# Patient Record
Sex: Female | Born: 1995 | Race: Black or African American | Hispanic: No | Marital: Single | Smoking: Never smoker
Health system: Southern US, Community
[De-identification: ages and names within clinical notes are randomized; demographics above are authoritative.]

## PROBLEM LIST (undated history)

## (undated) ENCOUNTER — Inpatient Hospital Stay (HOSPITAL_COMMUNITY): Payer: Self-pay

## (undated) DIAGNOSIS — R87629 Unspecified abnormal cytological findings in specimens from vagina: Secondary | ICD-10-CM

## (undated) DIAGNOSIS — J45909 Unspecified asthma, uncomplicated: Secondary | ICD-10-CM

## (undated) HISTORY — PX: NO PAST SURGERIES: SHX2092

## (undated) HISTORY — DX: Unspecified abnormal cytological findings in specimens from vagina: R87.629

---
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
## 2017-07-05 ENCOUNTER — Encounter (HOSPITAL_COMMUNITY): Payer: Self-pay

## 2018-06-01 ENCOUNTER — Encounter (HOSPITAL_COMMUNITY): Payer: Self-pay | Admitting: *Deleted

## 2018-06-01 ENCOUNTER — Inpatient Hospital Stay (HOSPITAL_COMMUNITY): Payer: Medicaid Other

## 2018-06-01 ENCOUNTER — Inpatient Hospital Stay (HOSPITAL_COMMUNITY)
Admission: AD | Admit: 2018-06-01 | Discharge: 2018-06-01 | Disposition: A | Discharge status: Home / Self Care | Payer: Medicaid Other | Source: Ambulatory Visit | Attending: Family Medicine | Admitting: Family Medicine

## 2018-06-01 ENCOUNTER — Other Ambulatory Visit: Payer: Self-pay

## 2018-06-01 DIAGNOSIS — O3680X0 Pregnancy with inconclusive fetal viability, not applicable or unspecified: Secondary | ICD-10-CM | POA: Diagnosis not present

## 2018-06-01 DIAGNOSIS — O26891 Other specified pregnancy related conditions, first trimester: Secondary | ICD-10-CM | POA: Diagnosis not present

## 2018-06-01 DIAGNOSIS — Z3A01 Less than 8 weeks gestation of pregnancy: Secondary | ICD-10-CM

## 2018-06-01 DIAGNOSIS — R109 Unspecified abdominal pain: Secondary | ICD-10-CM

## 2018-06-01 DIAGNOSIS — O26899 Other specified pregnancy related conditions, unspecified trimester: Secondary | ICD-10-CM

## 2018-06-01 LAB — URINALYSIS, ROUTINE W REFLEX MICROSCOPIC
BILIRUBIN URINE: NEGATIVE
Glucose, UA: NEGATIVE mg/dL
HGB URINE DIPSTICK: NEGATIVE
KETONES UR: NEGATIVE mg/dL
NITRITE: NEGATIVE
PROTEIN: NEGATIVE mg/dL
Specific Gravity, Urine: 1.014 (ref 1.005–1.030)
pH: 6 (ref 5.0–8.0)

## 2018-06-01 LAB — CBC WITH DIFFERENTIAL/PLATELET
BASOS PCT: 0 %
Basophils Absolute: 0 10*3/uL (ref 0.0–0.1)
EOS ABS: 0.3 10*3/uL (ref 0.0–0.7)
EOS PCT: 3 %
HEMATOCRIT: 35.5 % — AB (ref 36.0–46.0)
Hemoglobin: 12 g/dL (ref 12.0–15.0)
Lymphocytes Relative: 27 %
Lymphs Abs: 2.6 10*3/uL (ref 0.7–4.0)
MCH: 28.7 pg (ref 26.0–34.0)
MCHC: 33.8 g/dL (ref 30.0–36.0)
MCV: 84.9 fL (ref 78.0–100.0)
MONO ABS: 0.2 10*3/uL (ref 0.1–1.0)
MONOS PCT: 3 %
NEUTROS ABS: 6.4 10*3/uL (ref 1.7–7.7)
Neutrophils Relative %: 67 %
PLATELETS: 343 10*3/uL (ref 150–400)
RBC: 4.18 MIL/uL (ref 3.87–5.11)
RDW: 14.7 % (ref 11.5–15.5)
WBC: 9.6 10*3/uL (ref 4.0–10.5)

## 2018-06-01 LAB — HCG, QUANTITATIVE, PREGNANCY: HCG, BETA CHAIN, QUANT, S: 186 m[IU]/mL — AB (ref <5)

## 2018-06-01 LAB — COMPREHENSIVE METABOLIC PANEL
ALT: 18 U/L (ref 0–44)
ANION GAP: 9 (ref 5–15)
AST: 20 U/L (ref 15–41)
Albumin: 3.8 g/dL (ref 3.5–5.0)
Alkaline Phosphatase: 58 U/L (ref 38–126)
BILIRUBIN TOTAL: 0.8 mg/dL (ref 0.3–1.2)
BUN: 9 mg/dL (ref 6–20)
CHLORIDE: 104 mmol/L (ref 98–111)
CO2: 25 mmol/L (ref 22–32)
Calcium: 8.8 mg/dL — ABNORMAL LOW (ref 8.9–10.3)
Creatinine, Ser: 0.77 mg/dL (ref 0.44–1.00)
GFR calc non Af Amer: >60 mL/min (ref >60)
Glucose, Bld: 108 mg/dL — ABNORMAL HIGH (ref 70–99)
POTASSIUM: 3.2 mmol/L — AB (ref 3.5–5.1)
Sodium: 138 mmol/L (ref 135–145)
TOTAL PROTEIN: 7.5 g/dL (ref 6.5–8.1)

## 2018-06-01 LAB — WET PREP, GENITAL
CLUE CELLS WET PREP: NONE SEEN
Sperm: NONE SEEN
Trich, Wet Prep: NONE SEEN
YEAST WET PREP: NONE SEEN

## 2018-06-01 LAB — POCT PREGNANCY, URINE: Preg Test, Ur: POSITIVE — AB

## 2018-06-01 NOTE — MAU Provider Note (Signed)
History     CSN: [id number]  Arrival date and time: 06/01/18 1424   First Provider Initiated Contact with Patient 06/01/18 1621      Chief Complaint  Patient presents with  . Abdominal Pain  . Vaginal Bleeding  . Possible Pregnancy   HPI   Ms.Erika Maldonado is a 22 y.o. female G2P1001 @ 35w2dhere in MAU with abdominal pain. Says the pain started a few days ago. The pain comes and goes. The pain is very mild. She tried taking some tylenol which helped her pain. She noted some bleeding a few days ago, none now.   OB History    Gravida  2   Para  1   Term  1   Preterm      AB      Living  1     SAB      TAB      Ectopic      Multiple      Live Births  1           Past Medical History:  Diagnosis Date  . Asthma     Past Surgical History:  Procedure Laterality Date  . NO PAST SURGERIES      History reviewed. No pertinent family history.  Social History   Tobacco Use  . Smoking status: Never Smoker  . Smokeless tobacco: Never Used  Substance Use Topics  . Alcohol use: No  . Drug use: No    Allergies: No Known Allergies  Medications Prior to Admission  Medication Sig Dispense Refill Last Dose  . ALBUTEROL SULFATE HFA IN Inhale into the lungs.     . Prenatal Vit-Fe Fumarate-FA (MULTIVITAMIN-PRENATAL) 27-0.8 MG TABS tablet Take 1 tablet by mouth daily at 12 noon.      Results for orders placed or performed during the hospital encounter of 06/01/18 (from the past 48 hour(s))  Urinalysis, Routine w reflex microscopic     Status: Abnormal   Collection Time: 06/01/18  2:55 PM  Result Value Ref Range   Color, Urine YELLOW YELLOW   APPearance HAZY (A) CLEAR   Specific Gravity, Urine 1.014 1.005 - 1.030   pH 6.0 5.0 - 8.0   Glucose, UA NEGATIVE NEGATIVE mg/dL   Hgb urine dipstick NEGATIVE NEGATIVE   Bilirubin Urine NEGATIVE NEGATIVE   Ketones, ur NEGATIVE NEGATIVE mg/dL   Protein, ur NEGATIVE NEGATIVE mg/dL   Nitrite NEGATIVE NEGATIVE   Leukocytes, UA SMALL (A) NEGATIVE   RBC / HPF 0-5 0 - 5 RBC/hpf   WBC, UA 0-5 0 - 5 WBC/hpf   Bacteria, UA RARE (A) NONE SEEN   Squamous Epithelial / LPF 11-20 0 - 5   Mucus PRESENT     Comment: Performed at [facility] [address] [place] [id number] Pregnancy, urine POC     Status: Abnormal   Collection Time: 06/01/18  2:58 PM  Result Value Ref Range   Preg Test, Ur POSITIVE (A) NEGATIVE    Comment:        THE SENSITIVITY OF THIS METHODOLOGY IS >24 mIU/mL   CBC with Differential/Platelet     Status: Abnormal   Collection Time: 06/01/18  3:35 PM  Result Value Ref Range   WBC 9.6 4.0 - 10.5 K/uL   RBC 4.18 3.87 - 5.11 MIL/uL   Hemoglobin 12.0 12.0 - 15.0 g/dL   HCT 35.5 (L) 36.0 - 46.0 %   MCV 84.9 78.0 - 100.0 fL   MCH  28.7 26.0 - 34.0 pg   MCHC 33.8 30.0 - 36.0 g/dL   RDW 14.7 11.5 - 15.5 %   Platelets 343 150 - 400 K/uL   Neutrophils Relative % 67 %   Neutro Abs 6.4 1.7 - 7.7 K/uL   Lymphocytes Relative 27 %   Lymphs Abs 2.6 0.7 - 4.0 K/uL   Monocytes Relative 3 %   Monocytes Absolute 0.2 0.1 - 1.0 K/uL   Eosinophils Relative 3 %   Eosinophils Absolute 0.3 0.0 - 0.7 K/uL   Basophils Relative 0 %   Basophils Absolute 0.0 0.0 - 0.1 K/uL    Comment: Performed at [facility], [address] [place], [place] [address]  Comprehensive metabolic panel     Status: Abnormal   Collection Time: 06/01/18  3:35 PM  Result Value Ref Range   Sodium 138 135 - 145 mmol/L   Potassium 3.2 (L) 3.5 - 5.1 mmol/L   Chloride 104 98 - 111 mmol/L   CO2 25 22 - 32 mmol/L   Glucose, Bld 108 (H) 70 - 99 mg/dL   BUN 9 6 - 20 mg/dL   Creatinine, Ser 0.77 0.44 - 1.00 mg/dL   Calcium 8.8 (L) 8.9 - 10.3 mg/dL   Total Protein 7.5 6.5 - 8.1 g/dL   Albumin 3.8 3.5 - 5.0 g/dL   AST 20 15 - 41 U/L   ALT 18 0 - 44 U/L   Alkaline Phosphatase 58 38 - 126 U/L   Total Bilirubin 0.8 0.3 - 1.2 mg/dL   GFR calc non Af Amer >60 >60 mL/min   GFR calc Af Amer >60 >60 mL/min     Comment: (NOTE) The eGFR has been calculated using the CKD EPI equation. This calculation has not been validated in all clinical situations. eGFR's persistently <60 mL/min signify possible Chronic Kidney Disease.    Anion gap 9 5 - 15    Comment: Performed at [facility], [address], [place], [place] [address]  hCG, quantitative, pregnancy     Status: Abnormal   Collection Time: 06/01/18  3:35 PM  Result Value Ref Range   hCG, Beta Chain, Quant, S 186 (H) <5 mIU/mL    Comment:          GEST. AGE      CONC.  (mIU/mL)   <=1 WEEK        5 - 50     2 WEEKS       50 - 500     3 WEEKS       100 - 10,000     4 WEEKS     1,000 - 30,000     5 WEEKS     3,500 - 115,000   6-8 WEEKS     12,000 - 270,000    12 WEEKS     15,000 - 220,000        FEMALE AND NON-PREGNANT FEMALE:     LESS THAN 5 mIU/mL Performed at [facility], [address], [place], [place] [address]   Wet prep, genital     Status: Abnormal   Collection Time: 06/01/18  4:50 PM  Result Value Ref Range   Yeast Wet Prep HPF POC NONE SEEN NONE SEEN   Trich, Wet Prep NONE SEEN NONE SEEN   Clue Cells Wet Prep HPF POC NONE SEEN NONE SEEN   WBC, Wet Prep HPF POC MANY (A) NONE SEEN   Sperm NONE SEEN     Comment: Performed at  [facility], [place], [place] [address]   US Ob Less Than 14 Weeks With Ob Transvaginal  Result Date: 06/01/2018 CLINICAL DATA:  Abdominal pain and first trimester of pregnancy; positive urine pregnancy test, no quantitative beta HCG available for correlation EXAM: OBSTETRIC <14 WK [place] AND TRANSVAGINAL OB US TECHNIQUE: Both transabdominal and transvaginal ultrasound examinations were performed for complete evaluation of the gestation as well as the maternal uterus, adnexal regions, and pelvic cul-de-sac. Transvaginal technique was performed to assess early pregnancy. COMPARISON:  None for this gestation FINDINGS: Intrauterine gestational sac: Not visualized Yolk sac:  N/A Embryo:   N/A Cardiac Activity: N/A Heart Rate: N/A  bpm MSD:   mm    w     d CRL:    mm    w    d                  [place] EDC: Subchorionic hemorrhage:  N/A Maternal uterus/adnexae: Normal uterine morphology. Endometrial complex with trophoblastic reaction measuring up to 21 mm thick. No gestational sac, focal fluid collection or endometrial fluid definitely visualized. Small amount of simple appearing free pelvic fluid. RIGHT ovary measures 2.5 x 3.5 x 2.0 cm and contains a small dominant follicle 19 mm diameter. LEFT ovary measures 3.2 x 1.8 x 2.3 cm and contains a small corpus luteum. No adnexal masses. IMPRESSION: No intrauterine gestation identified. Findings are consistent with pregnancy of unknown location. Differential diagnosis includes early intrauterine pregnancy too early to visualize, spontaneous abortion, and ectopic pregnancy. Serial quantitative beta hCG and or followup ultrasound recommended to definitively exclude ectopic pregnancy. Electronically Signed   By: Lavonia Dana M.D.   On: 06/01/2018 16:33   Review of Systems  Constitutional: Negative for fever.  Gastrointestinal: Positive for abdominal pain. Negative for nausea and vomiting.  Genitourinary: Negative for vaginal bleeding and vaginal discharge.   Physical Exam   Blood pressure 122/63, pulse 94, temperature 98.9 F (37.2 C), temperature source Oral, resp. rate 17, height '5\' 9"'  (1.753 m), weight 134.7 kg, last menstrual period 05/02/2018, unknown if currently breastfeeding.  Physical Exam  Constitutional: She is oriented to person, place, and time. She appears well-developed and well-nourished. No distress.  HENT:  Head: Normocephalic.  Eyes: Pupils are equal, round, and reactive to light.  Neck: Neck supple.  GI: Soft. She exhibits no distension. There is tenderness in the suprapubic area. There is no rigidity, no rebound and no guarding.  Genitourinary:  Genitourinary Comments: Vagina - Small amount of white vaginal discharge, no  odor  Cervix - No contact bleeding, no active bleeding  Bimanual exam: Cervix closed Uterus non tender, normal size Adnexa non tender, no masses bilaterally GC/Chlam, wet prep done Chaperone present for exam.   Musculoskeletal: Normal range of motion.  Neurological: She is alert and oriented to person, place, and time.  Skin: Skin is warm. She is not diaphoretic.  Psychiatric: Her behavior is normal.   MAU Course  Procedures  None  MDM  B positive blood type  Wet prep & GC HIV, CBC, Hcg, ABO US OB transvaginal   Assessment and Plan   A:  1. Pregnancy of unknown anatomic location   2. Abdominal pain in pregnancy, antepartum   3. [redacted] weeks gestation of pregnancy     P:  Discharge home with strict ectopic precautions Pelvic rest  Return Friday after 4:00 pm for stat hcg level (no WOC appointments available) Prenatal vitamins daily Return to MAU if symptoms worsen  Briahna Pescador 06/01/2018, 5:26 PM

## 2018-06-01 NOTE — MAU Note (Signed)
4 +HPT yesterday.  Spotting noted today when wiped.  Cramping started a couple days ago, really bad today.

## 2018-06-01 NOTE — Discharge Instructions (Signed)

## 2018-06-02 LAB — GC/CHLAMYDIA PROBE AMP (~~LOC~~) NOT AT ARMC
Chlamydia: NEGATIVE
NEISSERIA GONORRHEA: NEGATIVE

## 2018-07-15 ENCOUNTER — Encounter: Payer: Medicaid Other | Admitting: Obstetrics and Gynecology

## 2018-07-18 ENCOUNTER — Telehealth: Payer: Self-pay | Admitting: Family Medicine

## 2018-07-18 NOTE — Telephone Encounter (Signed)
Patient called to make an appointment to start her prenatal care. She requested to be seen in the [facility] office.

## 2018-07-31 IMAGING — US US MFM OB LIMITED
1 series · 15 of 28 positions shown · non-contrast
Comparison: none

[Series 1: us mfm ob limited · 31 acquisitions, 15 frames shown]
[im 1/31]
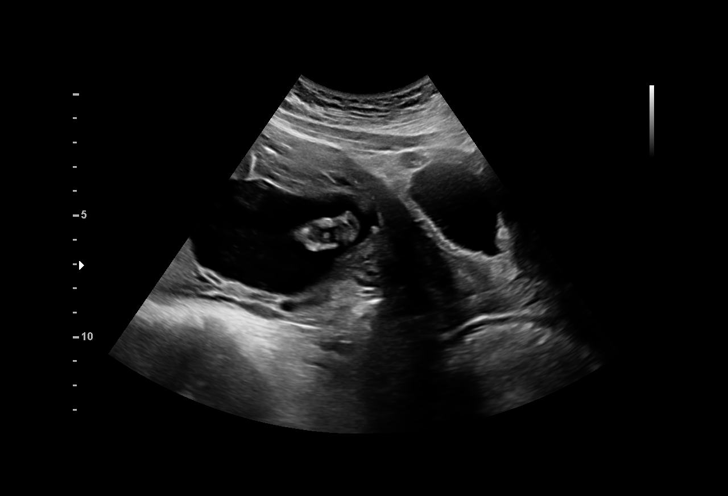
[im 3/31]
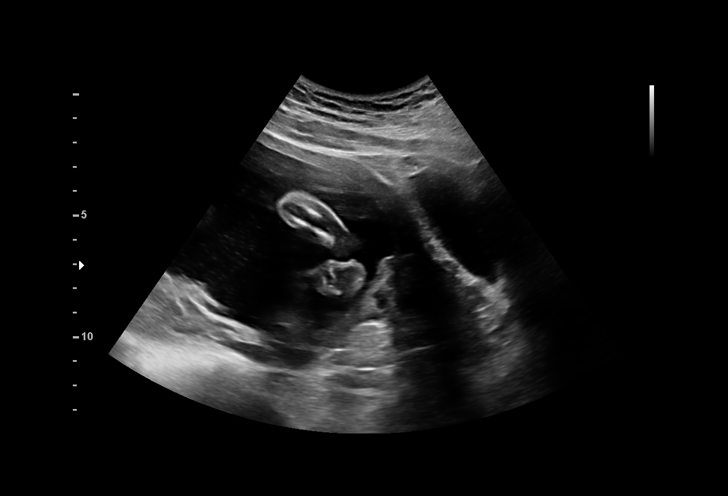
[im 5/31]
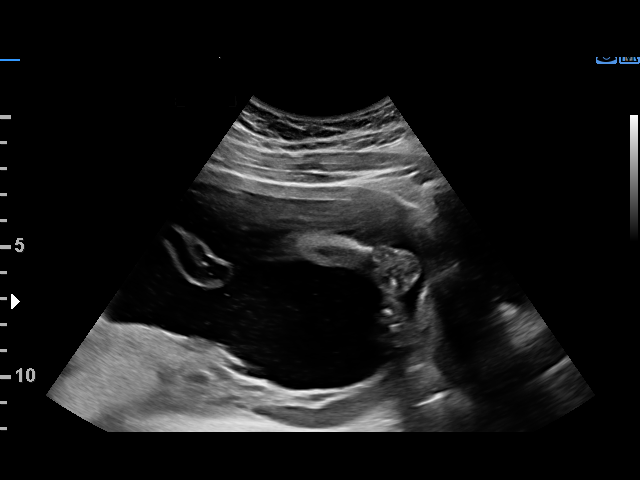
[im 7/31]
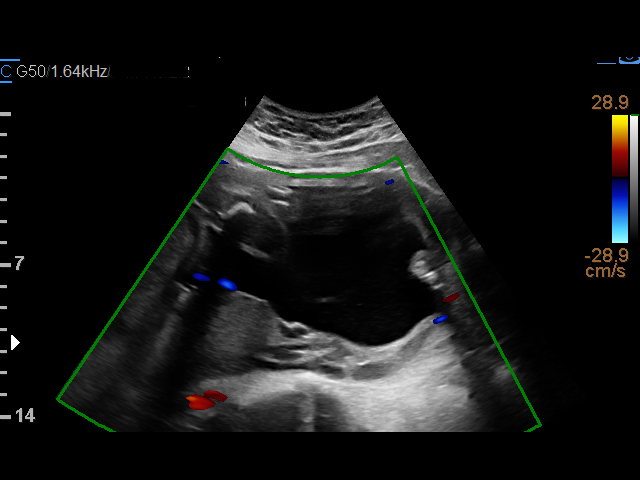
[im 9/31]
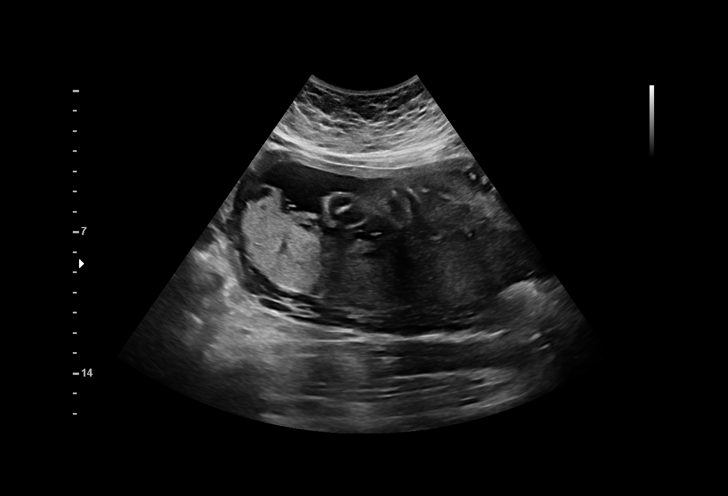
[im 12/31]
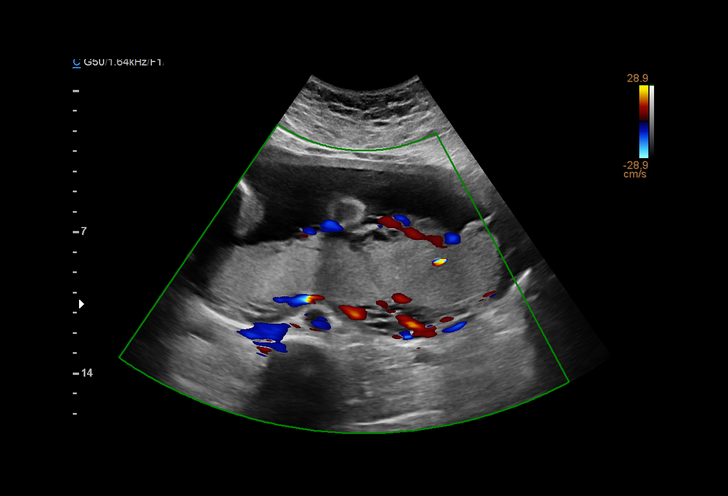
[im 14/31]
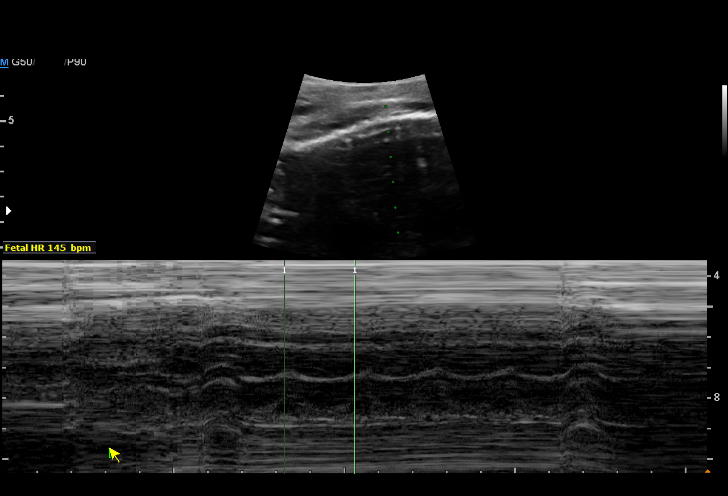
[im 16/31]
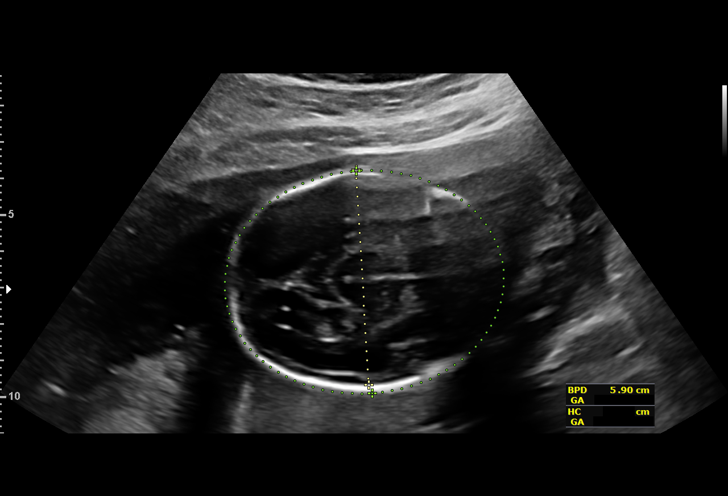
[im 17/31]
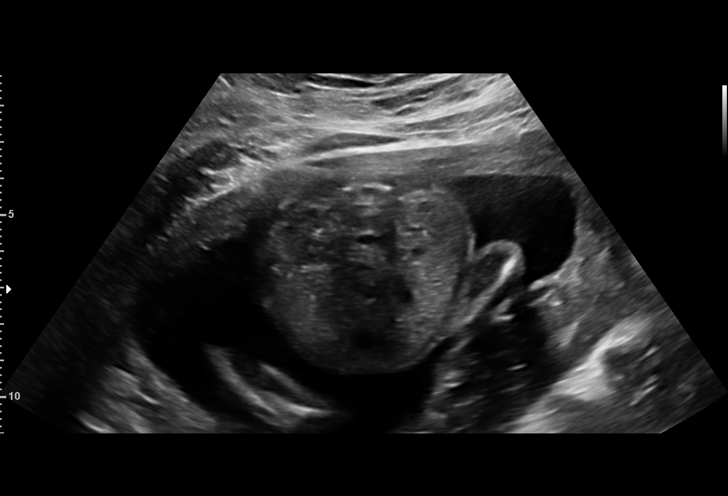
[im 19/31]
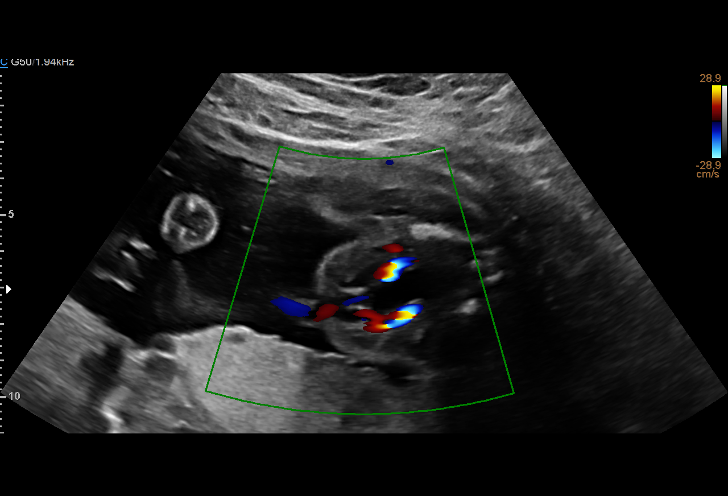
[im 22/31]
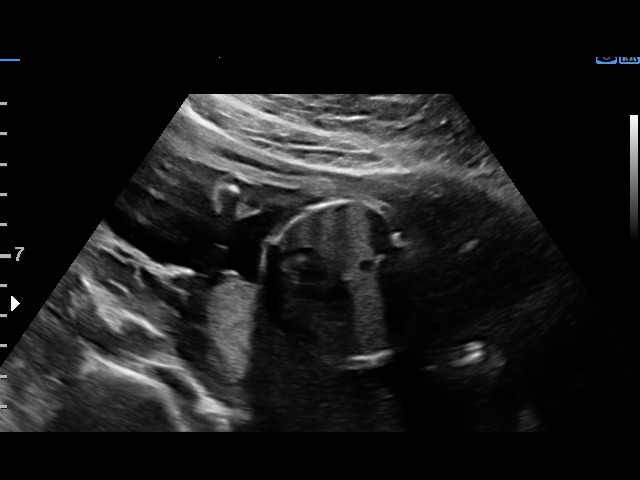
[im 24/31]
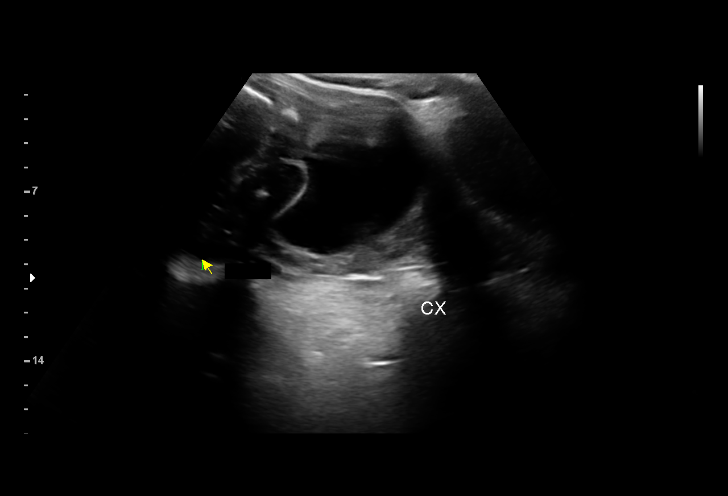
[im 26/31]
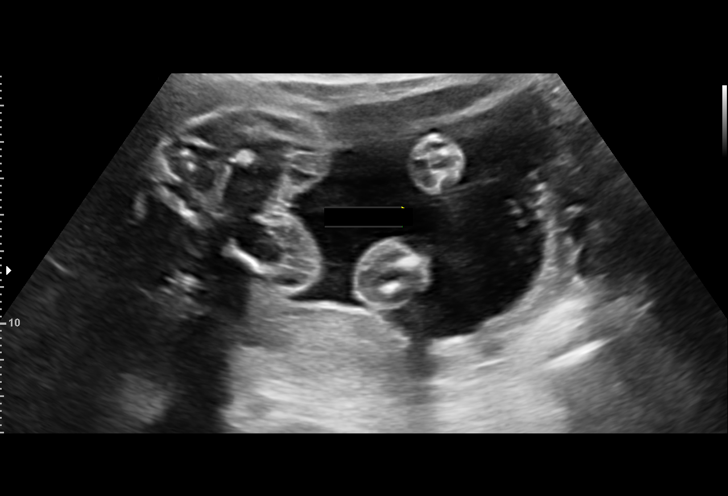
[im 28/31]
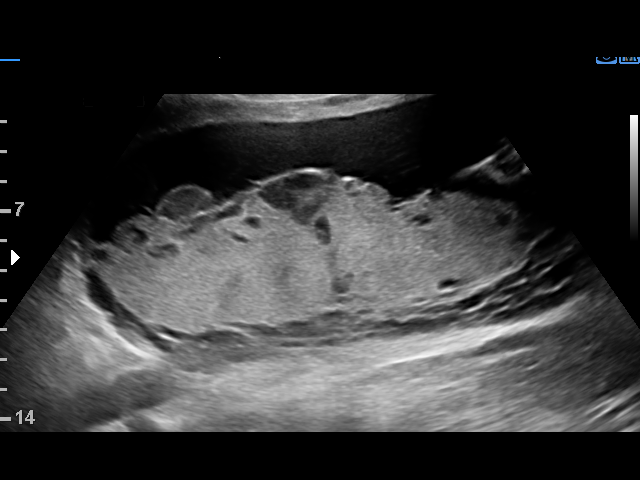
[im 31/31]
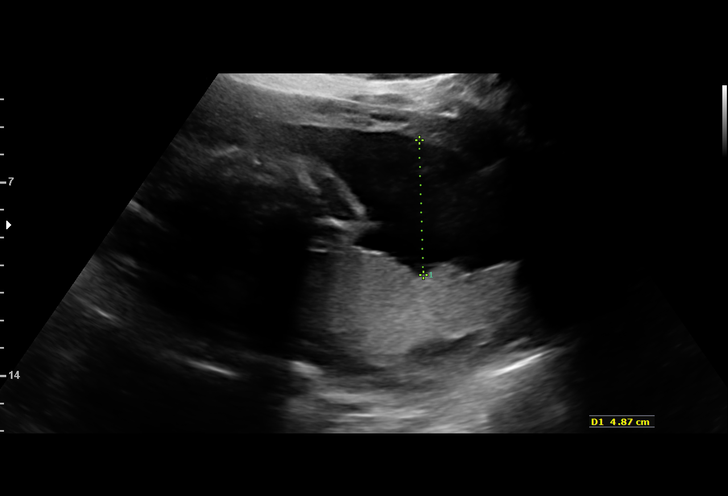

[15 of 28 positions shown; findings below may reference images not displayed]

Indications

24 weeks gestation of pregnancy
Vaginal bleeding in pregnancy, second
trimester
Abdominal pain in pregnancy
OB History

Gravidity:    1
Fetal Evaluation

Num Of Fetuses:     1
Fetal Heart         145
Rate(bpm):
Cardiac Activity:   Observed
Presentation:       Breech
Placenta:           Posterior, above cervical os
P. Cord Insertion:  Visualized

Amniotic Fluid
AFI FV:      Subjectively within normal limits

Largest Pocket(cm)
4.8

Comment:    No placental abruption or previa identified.
Biometry

BPD:        59  mm     G. Age:  24w 1d         48  %    CI:        74.24   %   70 - 86
HC:      217.4  mm     G. Age:  23w 5d         25  %
Gestational Age
LMP:           24w 0d       Date:   06/06/16                 EDD:   03/13/17
U/S Today:     24w 0d                                        EDD:   03/13/17
Best:          24w 0d    Det. By:   LMP  (06/06/16)          EDD:   03/13/17
Anatomy

Ventricles:            Appears normal         Kidneys:                Appear normal
Stomach:               Appears normal, left   Bladder:                Appears normal
sided
Abdominal Wall:        Appears nml (cord
insert, abd wall)

Other:  Fetus appears to be a female.
Cervix Uterus Adnexa

Cervix
Length:              4  cm.
Normal appearance by transabdominal scan.

Left Ovary
No adnexal mass visualized.

Right Ovary
No adnexal mass visualized.
Impression

SIUP at  85w2d with abdominal pain
Normal amniotic fluid volume and placentation
Normal fetal movement and cardiac activity
Cervical length 40mm transabdominally
Recommendations

No US explanation for patien'ts symptoms. continue clinical
evaluation and management

## 2018-08-10 ENCOUNTER — Encounter: Payer: Medicaid Other | Admitting: Certified Nurse Midwife

## 2018-08-18 ENCOUNTER — Encounter: Payer: Medicaid Other | Admitting: Obstetrics and Gynecology

## 2018-08-31 NOTE — L&D Delivery Note (Signed)
Delivery Note   IOL for morbid obesity. Patient received Cytotec x2 and labored rapidly At 10:22 AM a viable female, named Erika Maldonado was delivered via  (Presentation:LOA  ).  APGAR: 8, 9; weight pending Placenta status: spontaneous with 3 Vx Cord  Anesthesia:  epidural and paracervical block with 20 cc of Lidocaine 1% Episiotomy: None Lacerations: Vaginal;Labial Suture Repair: 3.0 Monocryl Est. Blood Loss (mL): 200  Mom to postpartum.  Baby to Couplet care / Skin to Skin. Plans breastfeeding and outpatient circumcision  Dede Query Rakwon Letourneau 02/07/2019, 10:48 AM

## 2019-01-13 LAB — OB RESULTS CONSOLE GBS: GBS: NEGATIVE

## 2019-01-25 ENCOUNTER — Telehealth (HOSPITAL_COMMUNITY): Payer: Self-pay | Admitting: *Deleted

## 2019-01-25 ENCOUNTER — Encounter (HOSPITAL_COMMUNITY): Payer: Self-pay | Admitting: *Deleted

## 2019-01-25 NOTE — Telephone Encounter (Signed)
Preadmission screen Pt refuses to come for early covid testing due to living in [place].  Office notified.  Pt states she plans to refuse testing on admission due to not wanting anything in her nose.  Explained importance of screening for her health and the health of her infant.  [facility] office notified of pt concerns and plan.

## 2019-02-01 ENCOUNTER — Other Ambulatory Visit: Payer: Self-pay | Admitting: Obstetrics and Gynecology

## 2019-02-03 ENCOUNTER — Other Ambulatory Visit (HOSPITAL_COMMUNITY): Admission: RE | Admit: 2019-02-03 | Payer: Medicaid Other | Source: Ambulatory Visit

## 2019-02-06 ENCOUNTER — Other Ambulatory Visit (HOSPITAL_COMMUNITY): Payer: Self-pay | Admitting: *Deleted

## 2019-02-06 NOTE — H&P (Signed)
Erika CarrowJada Maldonado is a 23 y.o. female, G2P1001, IUP at 40 weeks, presenting for IOL for maternal obesity, BMI 43, substance abuse during pregnancy, marijauna, +Utox on 05/2018, last use 05/2018., Abnormal pap (05/2018, LGSIL at prior office. Will plan repeat in 1 year per ASCCP guidelines.). Asthma, no complication or meds. Pt [place] from [place]. Pt endorse + Fm. Denies vaginal leakage. Denies vaginal bleeding. Denies feeling cxt's. Low risk female. GBS neg. Hep B status unable to find results. Pt does not want covid testing, denies s/sx, denies exposure. EFW 7.13lbs via US on 5/20.  Patient Active Problem List   Diagnosis Date Noted  . Obesity complicating pregnancy in third trimester 02/07/2019    Medications Prior to Admission  Medication Sig Dispense Refill Last Dose  . Prenatal Vit-Fe Fumarate-FA (MULTIVITAMIN-PRENATAL) 27-0.8 MG TABS tablet Take 1 tablet by mouth daily at 12 noon.   02/06/2019 at Unknown time  . ALBUTEROL SULFATE HFA IN Inhale into the lungs.   More than a month at Unknown time    Past Medical History:  Diagnosis Date  . Asthma   . Vaginal Pap smear, abnormal      No current facility-administered medications on file prior to encounter.    Current Outpatient Medications on File Prior to Encounter  Medication Sig Dispense Refill  . Prenatal Vit-Fe Fumarate-FA (MULTIVITAMIN-PRENATAL) 27-0.8 MG TABS tablet Take 1 tablet by mouth daily at 12 noon.    . ALBUTEROL SULFATE HFA IN Inhale into the lungs.       No Known Allergies  History of present pregnancy: Pt Info/Preference:  Screening/Consents:  Labs:   EDD: Estimated Date of Delivery: 02/06/19  Establised: Patient's last menstrual period was 05/02/2018.  Anatomy Scan: Date: 11/01/2018 Placenta Location: anterior Genetic Screen: Panoroma:low risk female AFP:  First Tri: Quad:  Office: [facility]            First PNV: 13 wg Blood Type --/--/B POS (06/09 0019)  Language: english Last PNV: 39 wg Rhogam    Flu Vaccine:  declined    Antibody NEG (06/09 0019)  TDaP vaccine declined   GTT: Early: N/A Third Trimester: 115  Feeding Plan: Breast/bottle BTL: no Rubella:    Contraception: ??? VBAC: no RPR:     Circumcision: no   HBsAg:    Pediatrician:  ???   HIV:     Prenatal Classes: no Additional US: Last BPP 8/8 Growth 5/20 see below GBS: Negative (05/15 0000)(For PCN allergy, check sensitivities)       Chlamydia: neg    MFM Referral/Consult:  GC: neg  Support Person: parnter   PAP: 2019-LGSIL  Pain Management: epidural Neonatologist Referral:  Hgb Electrophoresis:  N/A  Birth Plan: none   Hgb NOB: 12    28W: 12  Growth and BPP 01/18/2019  OB History    Gravida  2   Para  1   Term  1   Preterm      AB      Living  1     SAB      TAB      Ectopic      Multiple      Live Births  1          Past Medical History:  Diagnosis Date  . Asthma   . Vaginal Pap smear, abnormal    Past Surgical History:  Procedure Laterality Date  . NO PAST SURGERIES     Family History: family history includes Asthma in her paternal grandfather; Other  in her maternal grandmother. Social History:  reports that she has never smoked. She has never used smokeless tobacco. She reports that she does not drink alcohol or use drugs.   Prenatal Transfer Tool  Maternal Diabetes: No Genetic Screening: Normal Maternal Ultrasounds/Referrals: Normal Fetal Ultrasounds or other Referrals:  None Maternal Substance Abuse:  Yes:  Type: Marijuana Significant Maternal Medications:  None Significant Maternal Lab Results: Lab values include: Group B Strep negative  ROS:  Review of Systems  Constitutional: Negative.   HENT: Negative.   Eyes: Negative.   Respiratory: Negative.   Cardiovascular: Negative.   Gastrointestinal: Negative.   Genitourinary: Negative.   Musculoskeletal: Negative.   Skin: Negative.   Neurological: Negative.   Endo/Heme/Allergies: Negative.   Psychiatric/Behavioral: Negative.      Physical Exam:  BP (!) 112/57   Pulse 79   Temp 98.3 F (36.8 C) (Oral)   Resp 18   Ht 5\' 9"  (1.753 m)   Wt (!) 146.4 kg   LMP 05/02/2018   SpO2 98%   BMI 47.67 kg/m   Physical Exam  Constitutional: She is oriented to person, place, and time and well-developed, well-nourished, and in no distress.  HENT:  Head: Normocephalic and atraumatic.  Eyes: Pupils are equal, round, and reactive to light. Conjunctivae are normal.  Neck: Normal range of motion. Neck supple.  Cardiovascular: Normal rate and regular rhythm.  Pulmonary/Chest: Effort normal and breath sounds normal.  Abdominal: Soft. Bowel sounds are normal.  Genitourinary:    Genitourinary Comments: Pelvis adequate, soft non-tender uterus, gravida equal to dates.    Musculoskeletal: Normal range of motion.  Neurological: She is alert and oriented to person, place, and time. She has normal reflexes. Gait normal.  Skin: Skin is warm and dry.  Psychiatric: Affect normal.  Nursing note and vitals reviewed.    NST: FHR baseline 130 bpm, Variability: moderate, Accelerations:present, Decelerations:  Absent= Cat 1/Reactive UC:   none SVE:   Dilation: Closed Effacement (%): 20 Station: Ballotable Exam by:: J. Wilmer Santillo, CNM, vertex verified by fetal sutures.  Leopold's: Position vertex, EFW 8.5lbs via leopold's.  Pelvis proven 7.5lbs  Labs: Results for orders placed or performed during the hospital encounter of 02/07/19 (from the past 24 hour(s))  CBC     Status: Abnormal   Collection Time: 02/07/19 12:10 AM  Result Value Ref Range   WBC 7.4 4.0 - 10.5 K/uL   RBC 4.17 3.87 - 5.11 MIL/uL   Hemoglobin 11.9 (L) 12.0 - 15.0 g/dL   HCT 16.135.6 (L) 09.636.0 - 04.546.0 %   MCV 85.4 80.0 - 100.0 fL   MCH 28.5 26.0 - 34.0 pg   MCHC 33.4 30.0 - 36.0 g/dL   RDW 40.913.6 81.111.5 - 91.415.5 %   Platelets 280 150 - 400 K/uL   nRBC 0.0 0.0 - 0.2 %  Type and screen     Status: None   Collection Time: 02/07/19 12:19 AM  Result Value Ref Range   ABO/RH(D) B POS    Antibody  Screen NEG    Sample Expiration      02/10/2019,2359 Performed at [facility], [address] [place], [place] [address]     Imaging:  No results found.  MAU Course: Orders Placed This Encounter  Procedures  . SARS Coronavirus 2 (CEPHEID - Performed [facility]), [facility] Order  . Novel Coronavirus, NAA (hospital order; send-out to ref lab)  . OB RESULT CONSOLE Group B Strep  . Hepatitis B surface  antibody  . CBC  . RPR  . Diet regular Room service appropriate? Yes; Fluid consistency: Thin  . Discontinue Pitocin if tachysystole with non-reassuring FHR is present  . Evaluate fetal heart rate to establish reassuring pattern prior to initiating Cytotec or Pitocin  . If tachysystole WITH reassuring FHR present notify MD / CNM  . Initiate intrauterine resuscitation if tachysystole with non-reasuring FHR is present  . Labor Induction  . May administer Terbutaline 0.25 mg SQ x 1 dose if tachysystole with non-reassuring FHR is presesnt  . Nofify MD/CNM if tachysystole with non-reassuring FHR is present  . Perform a cervical exam prior to initiating Cytotec or Pitocin  . Activity as tolerated  . Fetal monitoring per unit policy  . Notify Physician  . Vitals signs per unit policy  . Order Rapid HIV per protocol if no results on chart  . Cervical Exam  . Discontinue foley prior to vaginal delivery  . Fundal check post delivery every 15 min x 1 hour then every 30 min x 1 hour  . If Rapid HIV test positive or known HIV positive: initiate AZT orders  . Initiate Carrier Fluid Protocol  . Initiate Oral Care Protocol  . Insert foley catheter  . May in and out cath x 2 for inability to void  . Measure blood pressure post delivery every 15 min x 1 hour then every 30 min x 1 hour  . Patient may have epidural placement upon request  . Full code  . Type and screen  . Insert and maintain IV Line  . Admit to Inpatient (patient's expected length of stay will be greater than 2  midnights or inpatient only procedure)   Meds ordered this encounter  Medications  . misoprostol (CYTOTEC) tablet 25 mcg  . oxytocin (PITOCIN) IV infusion 40 units in NS 1000 mL - Premix    Order Specific Question:   Begin infusion at:    Answer:   1 milli-unit/min (1.5 mL/hr)    Order Specific Question:   Increase infusion by:    Answer:   1 milli-unit/min (1.5 mL/hr)  . terbutaline (BRETHINE) injection 0.25 mg  . lactated ringers infusion  . lactated ringers infusion 500-1,000 mL  . oxytocin (PITOCIN) IV BOLUS FROM BAG  . oxytocin (PITOCIN) IV infusion 40 units in NS 1000 mL - Premix  . acetaminophen (TYLENOL) tablet 650 mg  . fentaNYL (SUBLIMAZE) injection 50-100 mcg  . lidocaine (PF) (XYLOCAINE) 1 % injection 30 mL  . ondansetron (ZOFRAN) injection 4 mg  . sodium citrate-citric acid (ORACIT) solution 30 mL  . sodium phosphate (FLEET) 7-19 GM/118ML enema 1 enema    Assessment/Plan: Erika Maldonado is a 23 y.o. female, G2P1001, IUP at 40 weeks, presenting for IOL for maternal obesity, BMI 43, substance abuse during pregnancy, marijauna, +Utox on 05/2018, last use 05/2018., Abnormal pap (05/2018, LGSIL at prior office. Will plan repeat in 1 year per ASCCP guidelines.). Asthma, no complication or meds. Pt drove from [place]. Pt endorse + Fm. Denies vaginal leakage. Denies vaginal bleeding. Denies feeling cxt's. Low risk female, no circ desired. GBS neg. Hep B status unable to find results. Pt does not want covid testing, denies s/sx, denies exposure. EFW 7.13lbs via [place] on 5/20.   FWB: Cat 1 Fetal Tracing.   Plan: Admit to [facility] per consult with Dr Simona Huh Routine [facility] orders Covid: Pending Pain med/epidural prn Induction with Cytotec.  Anticipate labor progression  Hep B Unknown: Pending lab.   [facility]  NP-C, CNM, MSN 02/07/2019, 2:12 AM

## 2019-02-07 ENCOUNTER — Other Ambulatory Visit: Payer: Self-pay

## 2019-02-07 ENCOUNTER — Encounter (HOSPITAL_COMMUNITY): Payer: Self-pay

## 2019-02-07 ENCOUNTER — Inpatient Hospital Stay (HOSPITAL_COMMUNITY): Payer: Medicaid Other

## 2019-02-07 ENCOUNTER — Inpatient Hospital Stay (HOSPITAL_COMMUNITY)
Admission: AD | Admit: 2019-02-07 | Discharge: 2019-02-09 | DRG: 807 | Disposition: A | Discharge status: Home / Self Care | Payer: Medicaid Other | Attending: Obstetrics & Gynecology | Admitting: Obstetrics & Gynecology

## 2019-02-07 ENCOUNTER — Inpatient Hospital Stay (HOSPITAL_COMMUNITY): Payer: Medicaid Other | Admitting: Anesthesiology

## 2019-02-07 DIAGNOSIS — Z1159 Encounter for screening for other viral diseases: Secondary | ICD-10-CM

## 2019-02-07 DIAGNOSIS — Z3A40 40 weeks gestation of pregnancy: Secondary | ICD-10-CM

## 2019-02-07 DIAGNOSIS — O99324 Drug use complicating childbirth: Secondary | ICD-10-CM | POA: Diagnosis present

## 2019-02-07 DIAGNOSIS — O99214 Obesity complicating childbirth: Principal | ICD-10-CM | POA: Diagnosis present

## 2019-02-07 DIAGNOSIS — J45909 Unspecified asthma, uncomplicated: Secondary | ICD-10-CM | POA: Diagnosis present

## 2019-02-07 DIAGNOSIS — O99213 Obesity complicating pregnancy, third trimester: Secondary | ICD-10-CM | POA: Diagnosis present

## 2019-02-07 DIAGNOSIS — O9952 Diseases of the respiratory system complicating childbirth: Secondary | ICD-10-CM | POA: Diagnosis present

## 2019-02-07 DIAGNOSIS — F1210 Cannabis abuse, uncomplicated: Secondary | ICD-10-CM | POA: Diagnosis present

## 2019-02-07 LAB — CBC
HCT: 35.6 % — ABNORMAL LOW (ref 36.0–46.0)
Hemoglobin: 11.9 g/dL — ABNORMAL LOW (ref 12.0–15.0)
MCH: 28.5 pg (ref 26.0–34.0)
MCHC: 33.4 g/dL (ref 30.0–36.0)
MCV: 85.4 fL (ref 80.0–100.0)
Platelets: 280 10*3/uL (ref 150–400)
RBC: 4.17 MIL/uL (ref 3.87–5.11)
RDW: 13.6 % (ref 11.5–15.5)
WBC: 7.4 10*3/uL (ref 4.0–10.5)
nRBC: 0 % (ref 0.0–0.2)

## 2019-02-07 LAB — TYPE AND SCREEN
ABO/RH(D): B POS
Antibody Screen: NEGATIVE

## 2019-02-07 LAB — RPR: RPR Ser Ql: NONREACTIVE

## 2019-02-07 LAB — SARS CORONAVIRUS 2: SARS Coronavirus 2: NOT DETECTED

## 2019-02-07 MED ORDER — FENTANYL CITRATE (PF) 100 MCG/2ML IJ SOLN
50.0000 ug | INTRAMUSCULAR | Status: DC | PRN
  Administered 2019-02-07: 50 ug via INTRAVENOUS
  Filled 2019-02-07: qty 2

## 2019-02-07 MED ORDER — ONDANSETRON HCL 4 MG/2ML IJ SOLN: 4.0000 mg | INTRAMUSCULAR | Status: DC | PRN

## 2019-02-07 MED ORDER — MISOPROSTOL 25 MCG QUARTER TABLET
25.0000 ug | ORAL_TABLET | ORAL | Status: DC | PRN
  Administered 2019-02-07 (×2): 25 ug via VAGINAL
  Filled 2019-02-07 (×2): qty 1

## 2019-02-07 MED ORDER — ONDANSETRON HCL 4 MG/2ML IJ SOLN: 4.0000 mg | Freq: Four times a day (QID) | INTRAMUSCULAR | Status: DC | PRN

## 2019-02-07 MED ORDER — WITCH HAZEL-GLYCERIN EX PADS: 1.0000 "application " | MEDICATED_PAD | CUTANEOUS | Status: DC | PRN

## 2019-02-07 MED ORDER — FLEET ENEMA 7-19 GM/118ML RE ENEM: 1.0000 | ENEMA | RECTAL | Status: DC | PRN

## 2019-02-07 MED ORDER — TETANUS-DIPHTH-ACELL PERTUSSIS 5-2.5-18.5 LF-MCG/0.5 IM SUSP: 0.5000 mL | Freq: Once | INTRAMUSCULAR | Status: DC

## 2019-02-07 MED ORDER — LACTATED RINGERS IV SOLN: 500.0000 mL | INTRAVENOUS | Status: DC | PRN

## 2019-02-07 MED ORDER — BENZOCAINE-MENTHOL 20-0.5 % EX AERO
1.0000 "application " | INHALATION_SPRAY | CUTANEOUS | Status: DC | PRN
  Administered 2019-02-07: 1 via TOPICAL
  Filled 2019-02-07: qty 56

## 2019-02-07 MED ORDER — LACTATED RINGERS IV SOLN
INTRAVENOUS | Status: DC
  Administered 2019-02-07 (×2): via INTRAVENOUS

## 2019-02-07 MED ORDER — ONDANSETRON HCL 4 MG PO TABS: 4.0000 mg | ORAL_TABLET | ORAL | Status: DC | PRN

## 2019-02-07 MED ORDER — OXYTOCIN 40 UNITS IN NORMAL SALINE INFUSION - SIMPLE MED: 1.0000 m[IU]/min | INTRAVENOUS | Status: DC

## 2019-02-07 MED ORDER — SENNOSIDES-DOCUSATE SODIUM 8.6-50 MG PO TABS
2.0000 | ORAL_TABLET | ORAL | Status: DC
  Administered 2019-02-08 – 2019-02-09 (×2): 2 via ORAL
  Filled 2019-02-07 (×2): qty 2

## 2019-02-07 MED ORDER — PHENYLEPHRINE 40 MCG/ML (10ML) SYRINGE FOR IV PUSH (FOR BLOOD PRESSURE SUPPORT): 80.0000 ug | PREFILLED_SYRINGE | INTRAVENOUS | Status: DC | PRN

## 2019-02-07 MED ORDER — DIPHENHYDRAMINE HCL 25 MG PO CAPS: 25.0000 mg | ORAL_CAPSULE | Freq: Four times a day (QID) | ORAL | Status: DC | PRN

## 2019-02-07 MED ORDER — DIPHENHYDRAMINE HCL 50 MG/ML IJ SOLN: 12.5000 mg | INTRAMUSCULAR | Status: DC | PRN

## 2019-02-07 MED ORDER — ENOXAPARIN SODIUM 80 MG/0.8ML ~~LOC~~ SOLN
0.5000 mg/kg | SUBCUTANEOUS | Status: DC
  Filled 2019-02-07: qty 0.8

## 2019-02-07 MED ORDER — SODIUM CHLORIDE (PF) 0.9 % IJ SOLN
INTRAMUSCULAR | Status: DC | PRN
  Administered 2019-02-07: 12 mL/h via EPIDURAL

## 2019-02-07 MED ORDER — EPHEDRINE 5 MG/ML INJ: 10.0000 mg | INTRAVENOUS | Status: DC | PRN

## 2019-02-07 MED ORDER — LIDOCAINE HCL 1 % IJ SOLN
10.0000 mL | Freq: Once | INTRAMUSCULAR | Status: AC
  Administered 2019-02-07: 30 mL
  Filled 2019-02-07: qty 10

## 2019-02-07 MED ORDER — IBUPROFEN 600 MG PO TABS
600.0000 mg | ORAL_TABLET | Freq: Four times a day (QID) | ORAL | Status: DC
  Administered 2019-02-07 – 2019-02-09 (×9): 600 mg via ORAL
  Filled 2019-02-07 (×9): qty 1

## 2019-02-07 MED ORDER — FENTANYL-BUPIVACAINE-NACL 0.5-0.125-0.9 MG/250ML-% EP SOLN: 12.0000 mL/h | EPIDURAL | Status: DC | PRN

## 2019-02-07 MED ORDER — SOD CITRATE-CITRIC ACID 500-334 MG/5ML PO SOLN: 30.0000 mL | ORAL | Status: DC | PRN

## 2019-02-07 MED ORDER — FENTANYL-BUPIVACAINE-NACL 0.5-0.125-0.9 MG/250ML-% EP SOLN
EPIDURAL | Status: AC
  Filled 2019-02-07: qty 250

## 2019-02-07 MED ORDER — TERBUTALINE SULFATE 1 MG/ML IJ SOLN: 0.2500 mg | Freq: Once | INTRAMUSCULAR | Status: DC | PRN

## 2019-02-07 MED ORDER — MEASLES, MUMPS & RUBELLA VAC IJ SOLR: 0.5000 mL | Freq: Once | INTRAMUSCULAR | Status: DC

## 2019-02-07 MED ORDER — LACTATED RINGERS IV SOLN
500.0000 mL | Freq: Once | INTRAVENOUS | Status: AC
  Administered 2019-02-07: 500 mL via INTRAVENOUS

## 2019-02-07 MED ORDER — LIDOCAINE HCL (PF) 1 % IJ SOLN
INTRAMUSCULAR | Status: DC | PRN
  Administered 2019-02-07 (×2): 5 mL via EPIDURAL

## 2019-02-07 MED ORDER — DIBUCAINE (PERIANAL) 1 % EX OINT: 1.0000 "application " | TOPICAL_OINTMENT | CUTANEOUS | Status: DC | PRN

## 2019-02-07 MED ORDER — PRENATAL MULTIVITAMIN CH
1.0000 | ORAL_TABLET | Freq: Every day | ORAL | Status: DC
  Administered 2019-02-07 – 2019-02-09 (×3): 1 via ORAL
  Filled 2019-02-07 (×3): qty 1

## 2019-02-07 MED ORDER — COCONUT OIL OIL: 1.0000 "application " | TOPICAL_OIL | Status: DC | PRN

## 2019-02-07 MED ORDER — FERROUS SULFATE 325 (65 FE) MG PO TABS
325.0000 mg | ORAL_TABLET | Freq: Two times a day (BID) | ORAL | Status: DC
  Administered 2019-02-07 – 2019-02-09 (×4): 325 mg via ORAL
  Filled 2019-02-07 (×5): qty 1

## 2019-02-07 MED ORDER — SIMETHICONE 80 MG PO CHEW: 80.0000 mg | CHEWABLE_TABLET | ORAL | Status: DC | PRN

## 2019-02-07 MED ORDER — OXYTOCIN BOLUS FROM INFUSION
500.0000 mL | Freq: Once | INTRAVENOUS | Status: AC
  Administered 2019-02-07: 500 mL via INTRAVENOUS

## 2019-02-07 MED ORDER — ACETAMINOPHEN 325 MG PO TABS: 650.0000 mg | ORAL_TABLET | ORAL | Status: DC | PRN

## 2019-02-07 MED ORDER — LIDOCAINE HCL (PF) 1 % IJ SOLN
30.0000 mL | INTRAMUSCULAR | Status: AC | PRN
  Administered 2019-02-07: 30 mL via SUBCUTANEOUS
  Filled 2019-02-07 (×2): qty 30

## 2019-02-07 MED ORDER — ACETAMINOPHEN 325 MG PO TABS
650.0000 mg | ORAL_TABLET | ORAL | Status: DC | PRN
  Administered 2019-02-07 – 2019-02-08 (×2): 650 mg via ORAL
  Filled 2019-02-07 (×2): qty 2

## 2019-02-07 MED ORDER — OXYTOCIN 40 UNITS IN NORMAL SALINE INFUSION - SIMPLE MED
2.5000 [IU]/h | INTRAVENOUS | Status: DC
  Administered 2019-02-07: 2.5 [IU]/h via INTRAVENOUS
  Filled 2019-02-07: qty 1000

## 2019-02-07 MED ORDER — ZOLPIDEM TARTRATE 5 MG PO TABS: 5.0000 mg | ORAL_TABLET | Freq: Every evening | ORAL | Status: DC | PRN

## 2019-02-07 NOTE — Anesthesia Postprocedure Evaluation (Signed)
Anesthesia Post Note  Patient: Erika Maldonado  Procedure(s) Performed: AN AD [place]     Patient location during evaluation: Mother Baby Anesthesia Type: Epidural Level of consciousness: awake and alert Pain management: pain level controlled Vital Signs Assessment: post-procedure vital signs reviewed and stable Respiratory status: spontaneous breathing, nonlabored ventilation and respiratory function stable Cardiovascular status: stable Postop Assessment: no headache, no backache and epidural receding Anesthetic complications: no    Last Vitals:  Vitals:   02/07/19 1213 02/07/19 1322  BP: (!) 76/33 (!) 102/52  Pulse: 76 71  Resp: 18   Temp: 37 C 36.9 C  SpO2: 97% 97%    Last Pain:  Vitals:   02/07/19 1325  TempSrc:   PainSc: 0-No pain   Pain Goal:                   [name]

## 2019-02-07 NOTE — Lactation Note (Signed)
This note was copied from a baby's chart. Lactation Consultation Note  Patient Name: Erika Maldonado [id number] Date: 02/07/2019 Reason for consult: Initial assessment;Term  P2 mother whose infant is now 72 hours old.  Baby was asleep at mother's side when I arrived.  He had fed 25 mls of formula two hours ago.  Mother's feeding preference is breast/bottle.  Discussed infant's tummy size and provided feeding supplementation guideline sheet for parents.  Reviewed feeding cues.  Mother demonstrated hand expression but was unable to obtain colostrum drops at this time.  Colostrum container provided for any EBM she obtains with hand expression.  Milk storage times reviewed and finger feeding demonstrated.  Encouraged feeding 8-12 times/24 hours or sooner if baby shows feeding cues.  Reminded mother to always put baby to breast prior to giving formula.  Suggested alternative ways of feeding rather than using an artificial nipple if mother desires.  Mother will call for latch assistance as needed.    Mother will return to work after leave and does not have a DEBP for home use.  She is a [facility] participant and [place] form faxed.  Explained how to obtain her pump after discharge.  Father present.  Mom made aware of O/P services, breastfeeding support groups, community resources, and our phone # for post-discharge questions.    Maternal Data Formula Feeding for Exclusion: No Has patient been taught Hand Expression?: Yes  Feeding Feeding Type: Bottle Fed - Formula  LATCH Score Latch: Grasps breast easily, tongue down, lips flanged, rhythmical sucking.  Audible Swallowing: A few with stimulation  Type of Nipple: Everted at rest and after stimulation  Comfort (Breast/Nipple): Soft / non-tender  Hold (Positioning): Assistance needed to correctly position infant at breast and maintain latch.  LATCH Score: 8  Interventions    Lactation Tools Discussed/Used WIC Program: Yes   Consult  Status Consult Status: Follow-up Date: 02/08/19 Follow-up type: In-patient    Little Ishikawa 02/07/2019, 6:34 PM

## 2019-02-07 NOTE — Progress Notes (Signed)
Labor Progress Note  Erika Maldonado is a 23 y.o. female, G2P1001, IUP at 40 weeks, presenting for IOL for maternal obesity, BMI 26, substance abuse during pregnancy, marijauna, +Utox on 05/2018, last use 05/2018., Abnormal pap (05/2018, LGSIL at prior office. Will plan repeat in 1 year per ASCCP guidelines.). Asthma, no complication or meds. Pt drove from [place]. Pt endorse + Fm. Denies vaginal leakage. Denies vaginal bleeding. Denies feeling cxt's. Low risk female. GBS neg. Hep B status unable to find results. Pt does not want covid testing, denies s/sx, denies exposure. EFW 7.13lbs via [place] on 5/20.  Subjective: Pt was resting in bed, up to the bathroom, feeling cxt but able to sleep through them.  Patient Active Problem List   Diagnosis Date Noted  . Obesity complicating pregnancy in third trimester 02/07/2019   Objective: BP 121/75   Pulse 74   Temp 98 F (36.7 C) (Oral)   Resp 16   Ht 5\' 9"  (1.753 m)   Wt (!) 146.4 kg   LMP 05/02/2018   SpO2 98%   BMI 47.67 kg/m  No intake/output data recorded. No intake/output data recorded. NST: FHR baseline 140 bpm, Variability: moderate, Accelerations:present, Decelerations:  Absent= Cat 1/Reactive CTX:  irregular, every 1.5-4.5 minutes, lasting 50-60 seconds Uterus gravid, soft non tender, mild to palpate with contractions.  SVE:  Dilation: Closed Effacement (%): 20 Station: Ballotable Exam by:: Jerrell Mylar, RN Pitocin at (Not started) mUn/min  Assessment:  Erika Maldonado is a 23 y.o. female, G2P1001, IUP at 40 weeks, presenting for IOL for maternal obesity, BMI 43, substance abuse during pregnancy, marijauna, +Utox on 05/2018, last use 05/2018., Abnormal pap (05/2018, LGSIL at prior office. Will plan repeat in 1 year per ASCCP guidelines.). Asthma, no complication or meds. Pt drove from [place]. Pt endorse + Fm. Denies vaginal leakage. Denies vaginal bleeding. Denies feeling cxt's. Low risk female. GBS neg. Hep B status unable to find results. Pt  does not want covid testing, denies s/sx, denies exposure. EFW 7.13lbs via [place] on 5/20. Pt feeling contraction, on second Cytotec, stable. Covid testing not detected.   Patient Active Problem List   Diagnosis Date Noted  . Obesity complicating pregnancy in third trimester 02/07/2019   NICHD: Category 1  Membranes:  Intact, no s/s of infection  Induction:    Cytotec x2 @ 0103 & 0511 on 6/9  Foley Bulb: To be inserted PRN  Pitocin - Not started yet  Pain management:               IV pain management: x 0             Epidural placement: PRN  GBS Negative   Plan: Continue labor plan Continuous/intermittent monitoring Light labor meal for breakfast. Rest Ambulate Frequent position changes to facilitate fetal rotation and descent. Dr Cletis Media to assume care @ 0700 and  reassess with cervical exam at 0900 or earlier if necessary Anticipate foley bulb placement. Then pitocin per protocol when cervix ripen.  Anticipate labor progression and vaginal delivery.   Md Rivard will be made aware of plan and when report given @ 0700.   Noralyn Pick, NP-C, CNM, MSN 02/07/2019. 6:51 AM

## 2019-02-07 NOTE — Anesthesia Procedure Notes (Signed)
Epidural Patient location during procedure: OB Start time: 02/07/2019 9:04 AM End time: 02/07/2019 9:19 AM  Staffing Anesthesiologist: Duane Boston, MD Performed: anesthesiologist   Preanesthetic Checklist Completed: patient identified, site marked, pre-op evaluation, timeout performed, IV checked, risks and benefits discussed and monitors and equipment checked  Epidural Patient position: sitting Prep: DuraPrep Patient monitoring: heart rate, cardiac monitor, continuous pulse ox and blood pressure Approach: midline Location: L2-L3 Injection technique: LOR saline  Needle:  Needle type: Tuohy  Needle gauge: 17 G Needle length: 9 cm Needle insertion depth: 8 cm Catheter size: 20 Guage Catheter at skin depth: 13 cm Test dose: negative and Other  Assessment Events: blood not aspirated, injection not painful, no injection resistance and negative IV test  Additional Notes Informed consent obtained prior to proceeding including risk of failure, 1% risk of PDPH, risk of minor discomfort and bruising.  Discussed rare but serious complications including epidural abscess, permanent nerve injury, epidural hematoma.  Discussed alternatives to epidural analgesia and patient desires to proceed.  Timeout performed pre-procedure verifying patient name, procedure, and platelet count.  Patient tolerated procedure well.

## 2019-02-07 NOTE — Plan of Care (Signed)
  Problem: Education: Goal: Knowledge of Childbirth will improve Outcome: Progressing Goal: Ability to make informed decisions regarding treatment and plan of care will improve Outcome: Progressing Goal: Ability to state and carry out methods to decrease the pain will improve Outcome: Progressing Goal: Individualized Educational Video(s) Outcome: Progressing   Problem: Coping: Goal: Ability to verbalize concerns and feelings about labor and delivery will improve Outcome: Progressing   Problem: Life Cycle: Goal: Ability to make normal progression through stages of labor will improve Outcome: Progressing Goal: Ability to effectively push during vaginal delivery will improve Outcome: Progressing   Problem: Role Relationship: Goal: Will demonstrate positive interactions with the child Outcome: Progressing   Problem: Safety: Goal: Risk of complications during labor and delivery will decrease Outcome: Progressing   Problem: Pain Management: Goal: Relief or control of pain from uterine contractions will improve Outcome: Progressing   Problem: Health Behavior/Discharge Planning: Goal: Ability to manage health-related needs will improve Outcome: Progressing   Problem: Clinical Measurements: Goal: Ability to maintain clinical measurements within normal limits will improve Outcome: Progressing Goal: Will remain free from infection Outcome: Progressing Goal: Diagnostic test results will improve Outcome: Progressing Goal: Respiratory complications will improve Outcome: Progressing Goal: Cardiovascular complication will be avoided Outcome: Progressing   Problem: Activity: Goal: Risk for activity intolerance will decrease Outcome: Progressing   Problem: Nutrition: Goal: Adequate nutrition will be maintained Outcome: Progressing   Problem: Elimination: Goal: Will not experience complications related to bowel motility Outcome: Progressing Goal: Will not experience  complications related to urinary retention Outcome: Progressing   Problem: Pain Managment: Goal: General experience of comfort will improve Outcome: Progressing   Problem: Skin Integrity: Goal: Risk for impaired skin integrity will decrease Outcome: Progressing   

## 2019-02-07 NOTE — Anesthesia Preprocedure Evaluation (Signed)
Anesthesia Evaluation  Patient identified by MRN, date of birth, ID band Patient awake    Reviewed: Allergy & Precautions, NPO status , Patient's Chart, lab work & pertinent test results  Airway Mallampati: III  TM Distance: >3 FB Neck ROM: Full    Dental no notable dental hx. (+) Dental Advisory Given   Pulmonary asthma ,    Pulmonary exam normal        Cardiovascular negative cardio ROS Normal cardiovascular exam     Neuro/Psych negative neurological ROS  negative psych ROS   GI/Hepatic negative GI ROS, Neg liver ROS,   Endo/Other  Morbid obesity  Renal/GU negative Renal ROS  negative genitourinary   Musculoskeletal negative musculoskeletal ROS (+)   Abdominal   Peds negative pediatric ROS (+)  Hematology negative hematology ROS (+)   Anesthesia Other Findings   Reproductive/Obstetrics negative OB ROS                             Anesthesia Physical Anesthesia Plan  ASA: III  Anesthesia Plan: Epidural   Post-op Pain Management:    Induction:   PONV Risk Score and Plan:   Airway Management Planned: Natural Airway  Additional Equipment:   Intra-op Plan:   Post-operative Plan:   Informed Consent: I have reviewed the patients History and Physical, chart, labs and discussed the procedure including the risks, benefits and alternatives for the proposed anesthesia with the patient or authorized representative who has indicated his/her understanding and acceptance.     Dental advisory given  Plan Discussed with: Anesthesiologist  Anesthesia Plan Comments:         Anesthesia Quick Evaluation

## 2019-02-08 LAB — HEPATITIS B SURFACE ANTIBODY, QUANTITATIVE: Hep B S AB Quant (Post): <3.1 m[IU]/mL — ABNORMAL LOW (ref >9.9)

## 2019-02-08 NOTE — Discharge Instructions (Signed)
Postpartum Care After Vaginal Delivery ° °The period of time right after you deliver your newborn is called the postpartum period. °What kind of medical care will I receive? °· You may continue to receive fluids and medicines through an IV tube inserted into one of your veins. °· If an incision was made near your vagina (episiotomy) or if you had some vaginal tearing during delivery, cold compresses may be placed on your episiotomy or your tear. This helps to reduce pain and swelling. °· You may be given a squirt bottle to use when you go to the bathroom. You may use this until you are comfortable wiping as usual. To use the squirt bottle, follow these steps: °? Before you urinate, fill the squirt bottle with warm water. Do not use hot water. °? After you urinate, while you are sitting on the toilet, use the squirt bottle to rinse the area around your urethra and vaginal opening. This rinses away any urine and blood. °? You may do this instead of wiping. As you start healing, you may use the squirt bottle before wiping yourself. Make sure to wipe gently. °? Fill the squirt bottle with clean water every time you use the bathroom. °· You will be given sanitary pads to wear. °How can I expect to feel? °· You may not feel the need to urinate for several hours after delivery. °· You will have some soreness and pain in your abdomen and vagina. °· If you are breastfeeding, you may have uterine contractions every time you breastfeed for up to several weeks postpartum. Uterine contractions help your uterus return to its normal size. °· It is normal to have vaginal bleeding (lochia) after delivery. The amount and appearance of lochia is often similar to a menstrual period in the first week after delivery. It will gradually decrease over the next few weeks to a dry, yellow-brown discharge. For most women, lochia stops completely by 6-8 weeks after delivery. Vaginal bleeding can vary from woman to woman. °· Within the first few  days after delivery, you may have breast engorgement. This is when your breasts feel heavy, full, and uncomfortable. Your breasts may also throb and feel hard, tightly stretched, warm, and tender. After this occurs, you may have milk leaking from your breasts. Your health care provider can help you relieve discomfort due to breast engorgement. Breast engorgement should go away within a few days. °· You may feel more sad or worried than normal due to hormonal changes after delivery. These feelings should not last more than a few days. If these feelings do not go away after several days, speak with your health care provider. °How should I care for myself? °· Tell your health care provider if you have pain or discomfort. °· Drink enough water to keep your urine clear or pale yellow. °· Wash your hands thoroughly with soap and water for at least 20 seconds after changing your sanitary pads, after using the toilet, and before holding or feeding your baby. °· If you are not breastfeeding, avoid touching your breasts a lot. Doing this can make your breasts produce more milk. °· If you become weak or lightheaded, or you feel like you might faint, ask for help before: °? Getting out of bed. °? Showering. °· Change your sanitary pads frequently. Watch for any changes in your flow, such as a sudden increase in volume, a change in color, the passing of large blood clots. If you pass a blood clot from your vagina,   save it to show to your health care provider. Do not flush blood clots down the toilet without having your health care provider look at them. °· Make sure that all your vaccinations are up to date. This can help protect you and your baby from getting certain diseases. You may need to have immunizations done before you leave the hospital. °· If desired, talk with your health care provider about methods of family planning or birth control (contraception). °How can I start bonding with my baby? °Spending as much time as  possible with your baby is very important. During this time, you and your baby can get to know each other and develop a bond. Having your baby stay with you in your room (rooming in) can give you time to get to know your baby. Rooming in can also help you become comfortable caring for your baby. Breastfeeding can also help you bond with your baby. °How can I plan for returning home with my baby? °· Make sure that you have a car seat installed in your vehicle. °? Your car seat should be checked by a certified car seat installer to make sure that it is installed safely. °? Make sure that your baby fits into the car seat safely. °· Ask your health care provider any questions you have about caring for yourself or your baby. Make sure that you are able to contact your health care provider with any questions after leaving the hospital. °This information is not intended to replace advice given to you by your health care provider. Make sure you discuss any questions you have with your health care provider. °Document Released: 06/14/2007 Document Revised: 01/20/2016 Document Reviewed: 07/22/2015 °Elsevier Interactive Patient Education © 2018 [employer] ° ° °Postpartum Depression and Baby Blues °The postpartum period begins right after the birth of a baby. During this time, there is often a great amount of joy and excitement. It is also a time of many changes in the life of the parents. Regardless of how many times a mother gives birth, each child brings new challenges and dynamics to the family. It is not unusual to have feelings of excitement along with confusing shifts in moods, emotions, and thoughts. All mothers are at risk of developing postpartum depression or the "baby blues." These mood changes can occur right after giving birth, or they may occur many months after giving birth. The baby blues or postpartum depression can be mild or severe. Additionally, postpartum depression can go away rather quickly, or it can  be a long-term condition. °What are the causes? °Raised hormone levels and the rapid drop in those levels are thought to be a main cause of postpartum depression and the baby blues. A number of hormones change during and after pregnancy. Estrogen and progesterone usually decrease right after the delivery of your baby. The levels of thyroid hormone and various cortisol steroids also rapidly drop. Other factors that play a role in these mood changes include major life events and genetics. °What increases the risk? °If you have any of the following risks for the baby blues or postpartum depression, know what symptoms to watch out for during the postpartum period. Risk factors that may increase the likelihood of getting the baby blues or postpartum depression include: °· Having a personal or family history of depression. °· Having depression while being pregnant. °· Having premenstrual mood issues or mood issues related to oral contraceptives. °· Having a lot of life stress. °· Having marital conflict. °· Lacking   a social support network. °· Having a baby with special needs. °· Having health problems, such as diabetes. ° °What are the signs or symptoms? °Symptoms of baby blues include: °· Brief changes in mood, such as going from extreme happiness to sadness. °· Decreased concentration. °· Difficulty sleeping. °· Crying spells, tearfulness. °· Irritability. °· Anxiety. ° °Symptoms of postpartum depression typically begin within the first month after giving birth. These symptoms include: °· Difficulty sleeping or excessive sleepiness. °· Marked weight loss. °· Agitation. °· Feelings of worthlessness. °· Lack of interest in activity or food. ° °Postpartum psychosis is a very serious condition and can be dangerous. Fortunately, it is rare. Displaying any of the following symptoms is cause for immediate medical attention. Symptoms of postpartum psychosis include: °· Hallucinations and delusions. °· Bizarre or disorganized  behavior. °· Confusion or disorientation. ° °How is this diagnosed? °A diagnosis is made by an evaluation of your symptoms. There are no medical or lab tests that lead to a diagnosis, but there are various questionnaires that a health care provider may use to identify those with the baby blues, postpartum depression, or psychosis. Often, a screening tool called the Edinburgh Postnatal Depression Scale is used to diagnose depression in the postpartum period. °How is this treated? °The baby blues usually goes away on its own in 1-2 weeks. Social support is often all that is needed. You will be encouraged to get adequate sleep and rest. Occasionally, you may be given medicines to help you sleep. °Postpartum depression requires treatment because it can last several months or longer if it is not treated. Treatment may include individual or group therapy, medicine, or both to address any social, physiological, and psychological factors that may play a role in the depression. Regular exercise, a healthy diet, rest, and social support may also be strongly recommended. °Postpartum psychosis is more serious and needs treatment right away. Hospitalization is often needed. °Follow these instructions at home: °· Get as much rest as you can. Nap when the baby sleeps. °· Exercise regularly. Some women find yoga and walking to be beneficial. °· Eat a balanced and nourishing diet. °· Do little things that you enjoy. Have a cup of tea, take a bubble bath, read your favorite magazine, or listen to your favorite music. °· Avoid alcohol. °· Ask for help with household chores, cooking, grocery shopping, or running errands as needed. Do not try to do everything. °· Talk to people close to you about how you are feeling. Get support from your partner, family members, friends, or other new moms. °· Try to stay positive in how you think. Think about the things you are grateful for. °· Do not spend a lot of time alone. °· Only take  over-the-counter or prescription medicine as directed by your health care provider. °· Keep all your postpartum appointments. °· Let your health care provider know if you have any concerns. °Contact a health care provider if: °You are having a reaction to or problems with your medicine. °Get help right away if: °· You have suicidal feelings. °· You think you may harm the baby or someone else. °This information is not intended to replace advice given to you by your health care provider. Make sure you discuss any questions you have with your health care provider. °Document Released: 05/21/2004 Document Revised: 01/23/2016 Document Reviewed: 05/29/2013 °Elsevier Interactive Patient Education © 2017 [employer]vier Inc. ° ° °

## 2019-02-08 NOTE — Discharge Summary (Signed)
OB Discharge Summary     Patient Name: Erika CarrowJada Leiner DOB: 20-Jun-1996 MRN: [id number]  Date of admission: 02/07/2019 Delivering MD: Silverio LayIVARD, SANDRA   Date of discharge: 02/09/2019  Admitting diagnosis: pregnancy Intrauterine pregnancy: 9864w1d     Secondary diagnosis:  Active Problems:   Obesity complicating pregnancy in third trimester     Discharge diagnosis: Term Pregnancy Delivered                                                                                                Post partum procedures:n/a  Augmentation: Cytotec  Complications: None  Hospital course:  Onset of Labor With Vaginal Delivery     23 y.o. yo [id number] at 8264w1d was admitted in Active Labor on 02/07/2019. Patient had an uncomplicated labor course as follows:  Membrane Rupture Time/Date: 8:30 AM ,02/07/2019   Intrapartum Procedures: Episiotomy: None [1]                                         Lacerations:  Vaginal [6];Labial [10]  Patient had a delivery of a Viable infant. 02/07/2019  Information for the patient's newborn:  Darcella CheshireBryant, Boy Rosaleen [[id number]][[id number]]  Delivery Method: Vaginal, Spontaneous(Filed from Delivery Summary)    Pateint had an uncomplicated postpartum course.  She is ambulating, tolerating a regular diet, passing flatus, and urinating well. Patient is discharged home in stable condition on 02/09/19.   Physical exam  Vitals:   02/08/19 0608 02/08/19 1401 02/08/19 2225 02/08/19 2327  BP: (!) 103/47 103/70 (!) 82/59 104/66  Pulse: 65 74 75 69  Resp: 18 14 16    Temp: 98.7 F (37.1 C) 98.3 F (36.8 C) 97.7 F (36.5 C)   TempSrc: Oral Oral Oral   SpO2:  97%    Weight:      Height:       General: alert, cooperative and no distress Lochia: appropriate Uterine Fundus: firm Incision: N/A DVT Evaluation: No evidence of DVT seen on physical exam. Negative Homan's sign. No cords or calf tenderness. No significant calf/ankle edema.  Labs: Lab Results  Component Value Date   WBC 7.4 02/07/2019   HGB 11.9 (L) 02/07/2019   HCT 35.6 (L) 02/07/2019   MCV 85.4 02/07/2019   PLT 280 02/07/2019   CMP Latest Ref Rng & Units 06/01/2018  Glucose 70 - 99 mg/dL 130(Q108(H)  BUN 6 - 20 mg/dL 9  Creatinine 6.570.44 - 8.461.00 mg/dL 9.620.77  Sodium 952135 - 841145 mmol/L 138  Potassium 3.5 - 5.1 mmol/L 3.2(L)  Chloride 98 - 111 mmol/L 104  CO2 22 - 32 mmol/L 25  Calcium 8.9 - 10.3 mg/dL 3.2(G8.8(L)  Total Protein 6.5 - 8.1 g/dL 7.5  Total Bilirubin 0.3 - 1.2 mg/dL 0.8  Alkaline Phos 38 - 126 U/L 58  AST 15 - 41 U/L 20  ALT 0 - 44 U/L 18    Discharge instruction: per After Visit Summary and "Baby and Me Booklet".  After visit meds:  Allergies as of 02/09/2019   No Known  Allergies     Medication List    TAKE these medications   ALBUTEROL SULFATE HFA IN Inhale into the lungs.   ibuprofen 600 MG tablet Commonly known as:  ADVIL Take 1 tablet (600 mg total) by mouth every 6 (six) hours.   multivitamin-prenatal 27-0.8 MG Tabs tablet Take 1 tablet by mouth daily at 12 noon.       Diet: routine diet  Activity: Advance as tolerated. Pelvic rest for 6 weeks.   Outpatient follow up:6 weeks Follow up Appt:No future appointments. Follow up Visit:No follow-ups on file.  Postpartum contraception: None  Newborn Data: Live born female  Birth Weight: 8 lb 5 oz (3770 g) APGAR: 8, 9  Newborn Delivery   Birth date/time:  02/07/2019 10:22:00 Delivery type:  Vaginal, Spontaneous     Baby Feeding: Bottle and Breast Disposition:home with mother   02/09/2019 Marikay Alar, CNM

## 2019-02-08 NOTE — Progress Notes (Signed)
Subjective: Postpartum Day # 1 : S/P NSVD due to IOL for obesity. Patient up ad lib, denies syncope or dizziness. Reports consuming regular diet without issues and denies N/V. Patient reports 0 bowel movement + passing flatus.  Denies issues with urination and reports bleeding is "lighter."  Patient is breastfeeding and reports going well.  Desires undecided for postpartum contraception.  Pain is being appropriately managed with use of po meds.   vaginal laceration Feeding:  breast Contraceptive plan:  undecided BB: Circ does not want  Objective: Vital signs in last 24 hours: Patient Vitals for the past 24 hrs:  BP Temp Temp src Pulse Resp SpO2  02/08/19 0041 97/75 99 F (37.2 C) Oral 70 18 -  02/07/19 2044 112/68 98.5 F (36.9 C) Oral 66 18 -  02/07/19 1702 98/64 98.4 F (36.9 C) Axillary 73 19 97 %  02/07/19 1322 (!) 102/52 98.4 F (36.9 C) - 71 - 97 %  02/07/19 1213 (!) 76/33 98.6 F (37 C) Oral 76 18 97 %  02/07/19 1133 (!) 123/109 - - (!) 103 - -  02/07/19 1117 119/76 - - 75 - -  02/07/19 1048 (!) 118/42 - - 83 - -  02/07/19 1033 99/62 - - (!) 101 - -  02/07/19 1023 (!) 79/65 - - (!) 154 - -  02/07/19 0959 (!) 93/39 - - 88 - -  02/07/19 0956 (!) 86/46 - - (!) 119 - -  02/07/19 0947 (!) 83/42 - - 88 - -  02/07/19 0944 (!) 75/42 - - 86 - -  02/07/19 0941 (!) 83/46 - - 89 - -  02/07/19 0938 (!) 91/46 - - 82 - -  02/07/19 0935 (!) 80/57 - - 90 - -  02/07/19 0932 (!) 85/47 - - 79 - -  02/07/19 0929 (!) 79/42 - - 96 - -  02/07/19 0926 (!) 90/42 - - 86 - -  02/07/19 0923 (!) 86/32 - - 85 - -  02/07/19 0920 (!) 95/41 - - 89 - -  02/07/19 0913 (!) 87/43 - - 95 - -  02/07/19 0910 (!) 95/40 - - 93 - -  02/07/19 0908 102/61 - - (!) 107 - -  02/07/19 0906 113/73 - - 95 - -  02/07/19 0716 (!) 115/53 97.9 F (36.6 C) - 73 - -  02/07/19 0600 121/75 - - 74 16 -  02/07/19 0502 116/61 - - 68 17 -  02/07/19 0402 98/70 98 F (36.7 C) Oral 75 17 -  02/07/19 0320 95/75 - - 83 18 -   02/07/19 0224 (!) 109/56 - - 70 18 -     Physical Exam:  General: alert, cooperative, appears stated age, no distress and morbidly obese Mood/Affect: Happy Lungs: clear to auscultation, no wheezes, rales or rhonchi, symmetric air entry.  Heart: normal rate, regular rhythm, normal S1, S2, no murmurs, rubs, clicks or gallops. Breast: breasts appear normal, no suspicious masses, no skin or nipple changes or axillary nodes. Abdomen:  + bowel sounds, soft, non-tender GU: perineum approximate, healing well. No signs of external hematomas.  Uterine Fundus: firm Lochia: appropriate Skin: Warm, Dry. DVT Evaluation: No evidence of DVT seen on physical exam. Negative Homan's sign. No cords or calf tenderness. No significant calf/ankle edema.  CBC Latest Ref Rng & Units 02/07/2019 06/01/2018  WBC 4.0 - 10.5 K/uL 7.4 9.6  Hemoglobin 12.0 - 15.0 g/dL 11.9(L) 12.0  Hematocrit 36.0 - 46.0 % 35.6(L) 35.5(L)  Platelets 150 - 400 K/uL  280 343    Results for orders placed or performed during the hospital encounter of 02/07/19 (from the past 24 hour(s))  SARS Coronavirus 2     Status: None   Collection Time: 02/07/19  2:13 AM  Result Value Ref Range   SARS Coronavirus 2 NOT DETECTED NOT DETECTED     CBG (last 3)  No results for input(s): GLUCAP in the last 72 hours.   I/O last 3 completed shifts: In: 1738.8 [I.V.:1738.8] Out: 200 [Blood:200]   Assessment Postpartum Day # 1 : S/P NSVD due to IOL for obesity. Pt stable. -1 involution. breastfeeding. Hemodynamically stable.   Plan: Continue other mgmt as ordered Obesity: Lovenox and SCDs Baby Female: No circ desired.  VTE prophylactics: Early ambulated as tolerates.  Pain control: Motrin/Tylenol PRN Education given regarding options for contraception, including barrier methods, injectable contraception, IUD placement, oral contraceptives.  Plan for discharge tomorrow, Breastfeeding and Lactation consult   Dr. Alwyn Pea to be updated on patient  status when she assumes care of pt @ 0700.  [facility] NP-C, CNM 02/08/2019, 2:02 AM

## 2019-02-08 NOTE — Progress Notes (Signed)
CSW received consult for hx of marijuana use.  Referral was screened out due to the following: ~MOB had no documented substance use after initial prenatal visit/+UPT.( Per H&P, MOB last use for THC was 05/2018-Initial OB not until 07/2018 ~MOB had no positive drug screens after initial prenatal visit/+UPT.   Please consult CSW if current concerns arise or by MOB's request.  CSW will monitor CDS results and make report to Child Protective Services if warranted.     Erika Maldonado, MSW, LCSW-A [facility] [place] [phone]  

## 2019-02-08 NOTE — Progress Notes (Signed)
E. Greer,CNM notified that patient continues to refuse labs.

## 2019-02-08 NOTE — Progress Notes (Signed)
Pt refused morning CBC and Lovenox.

## 2019-02-09 MED ORDER — IBUPROFEN 600 MG PO TABS: 600.0000 mg | ORAL_TABLET | Freq: Four times a day (QID) | ORAL | 0 refills | Status: AC

## 2019-02-09 NOTE — Lactation Note (Signed)
This note was copied from a baby's chart. Lactation Consultation Note  Patient Name: Erika Maldonado WGNFA'O Date: 02/09/2019   1st visit: Mom says the baby latches on the L, but not on the R side. She has not been using her hand pump when he doesn't latch on the R side. Mom would like to be set up with a DEBP. Mom will need a size 21 flange on the R breast.   2nd visit: I assisted Mom with latching to the R side. Infant did so relatively well using the teacup hold. Some swallows noted, but infant tends to fall asleep at the breast. Mom is noticing that infant's skin is a little yellow. RN and nursing student will set Mom up with the DEBP (using size 21 flange on R breast).  LC to return.  Matthias Hughs [facility] 02/09/2019, 10:04 AM

## 2019-02-09 NOTE — Lactation Note (Signed)
This note was copied from a baby's chart. Lactation Consultation Note  Patient Name: Erika Maldonado FRTMY'T Date: 02/09/2019   Mom requested formula. I initially attempted to give it with a slow-flow nipple, but it was too fast. I used the extra slow-flow nipple & it was better, but did require some occasional pacing (Mom was shown how to pace & position).   Mom was shown how to assemble & use hand pump that was included in pump kit. Mom has [place] ([place]). I encouraged Mom to pump whenever infant receives formula. Mom needed a size 27 flange for her L breast. Mom could decide between using the size 24 & 27 flange on her R breast (the size 21 flange did not permit enough motion of R nipple). Mom instructed to wash pump parts after every use.    Matthias Hughs [facility] 02/09/2019, 1:44 PM

## 2020-02-08 IMAGING — US US OB < 14 WEEKS - US OB TV
1 series · 15 of 28 positions shown · non-contrast
Comparison: None for this gestation

CLINICAL DATA: Abdominal pain and first trimester of pregnancy;
positive urine pregnancy test, no quantitative beta HCG available
for correlation

EXAM:
OBSTETRIC <14 WK US AND TRANSVAGINAL OB US
TECHNIQUE: Both transabdominal and transvaginal ultrasound examinations were
performed for complete evaluation of the gestation as well as the
maternal uterus, adnexal regions, and pelvic cul-de-sac.
Transvaginal technique was performed to assess early pregnancy.

[Series 1: us ob < 14 weeks - us ob tv · 68 acquisitions, 15 frames shown]
[im 1/68]
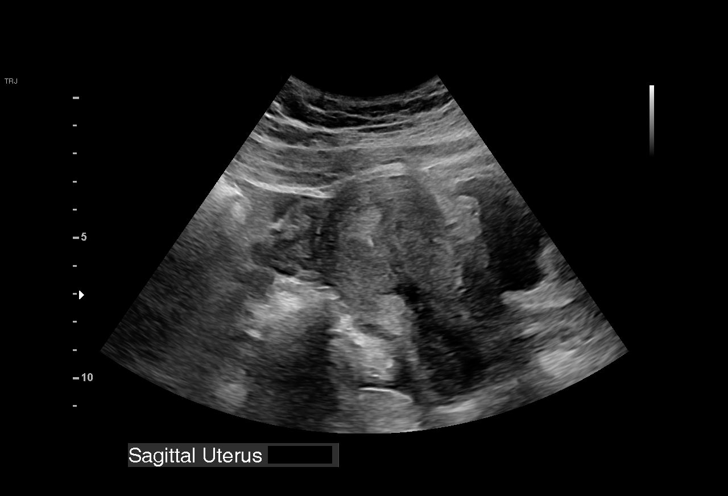
[im 5/68]
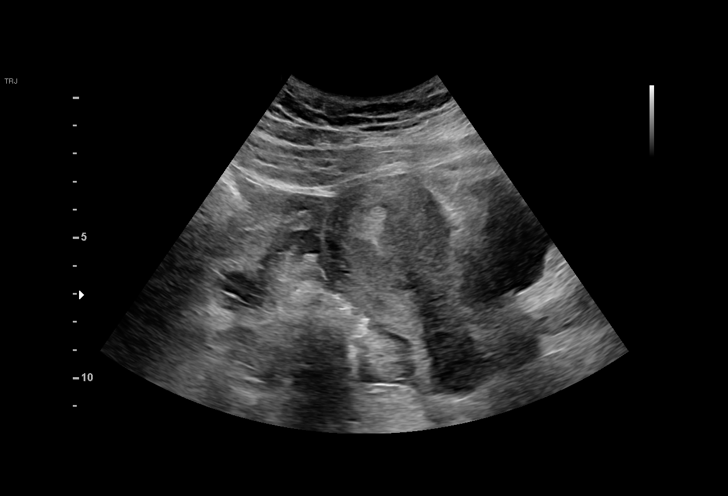
[im 10/68]
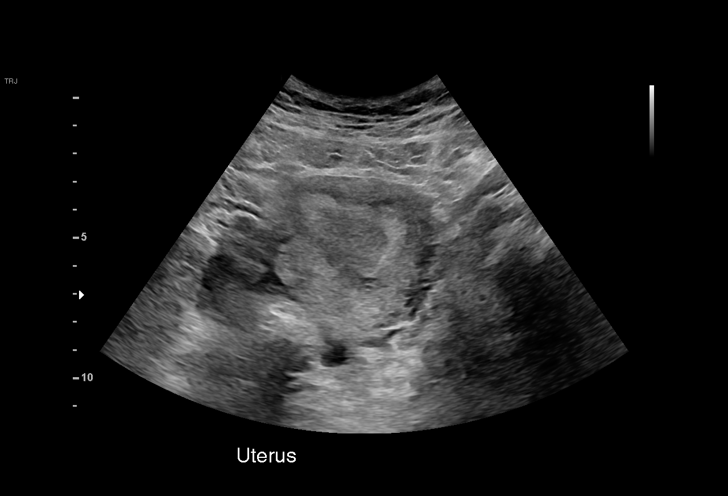
[im 15/68]
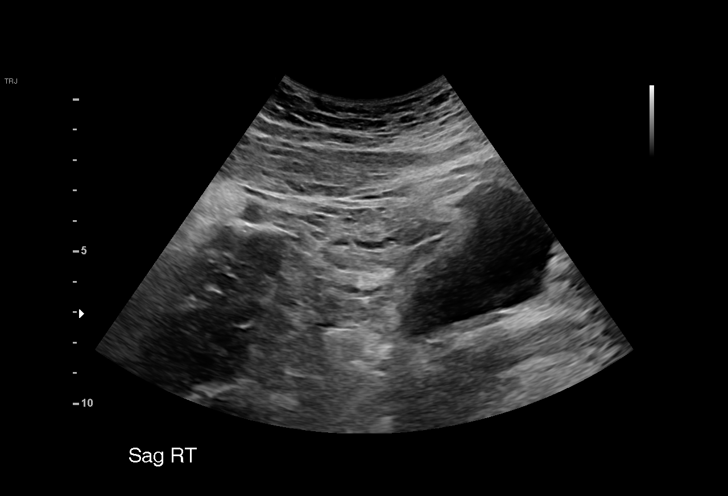
[im 20/68]
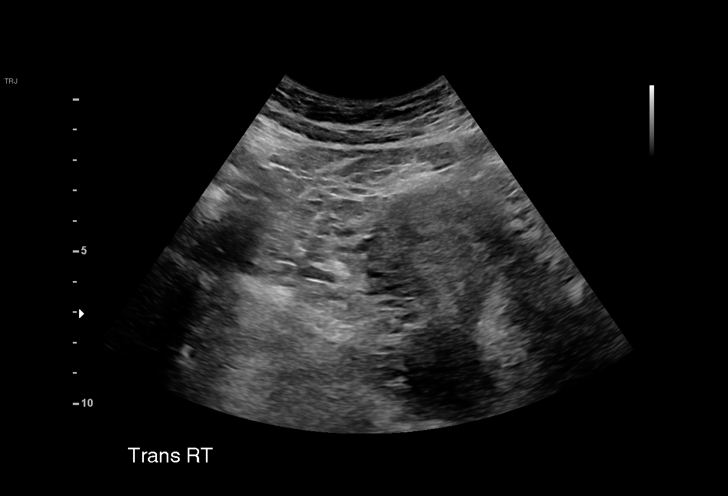
[im 25/68]
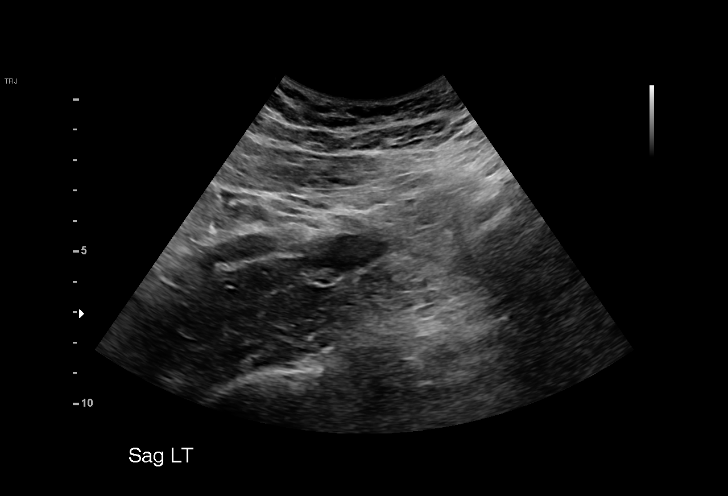
[im 30/68]
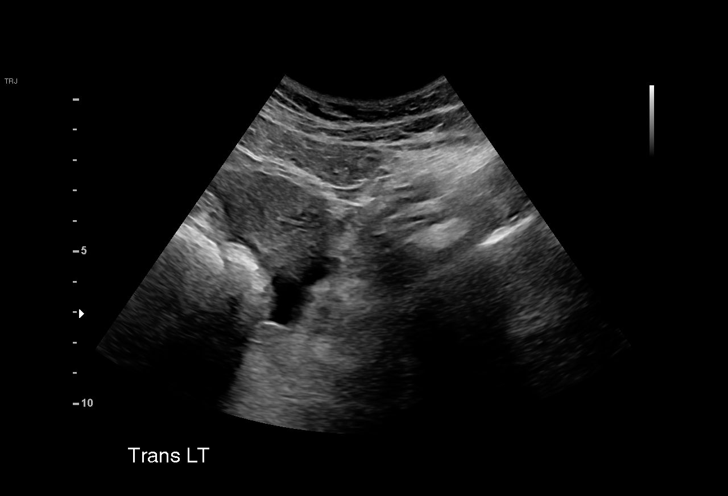
[im 35/68]
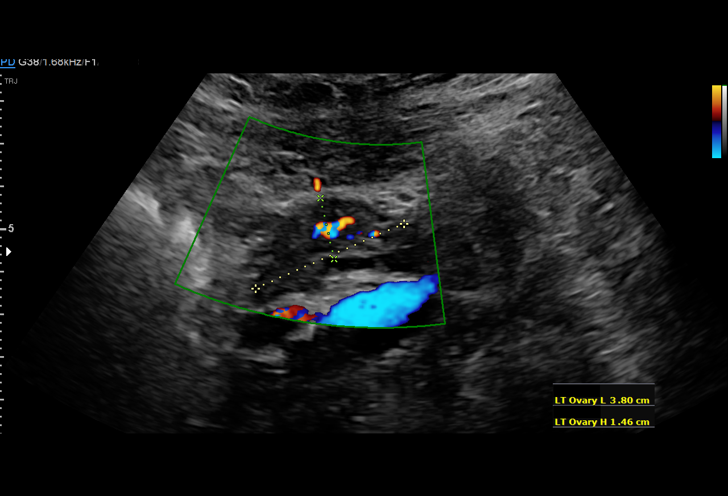
[im 38/68]
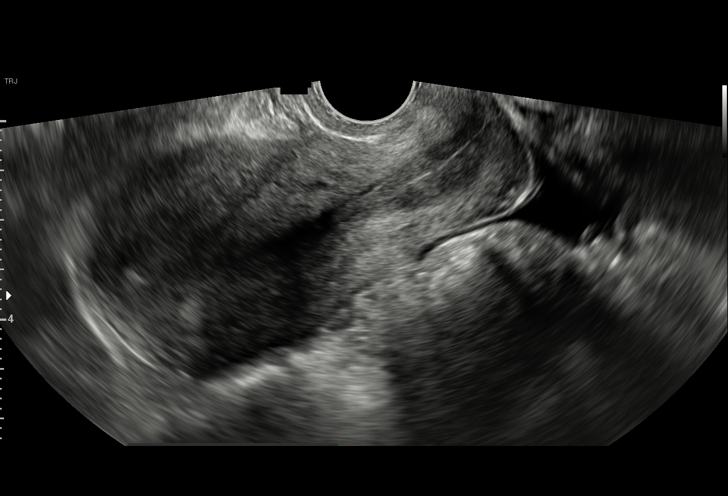
[im 43/68]
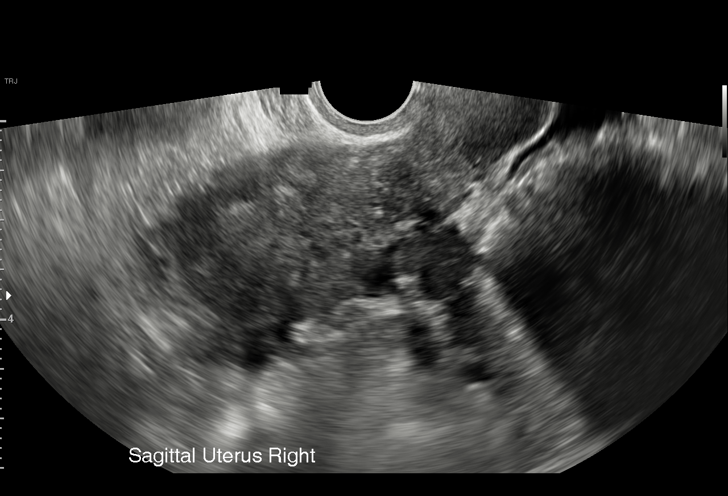
[im 48/68]
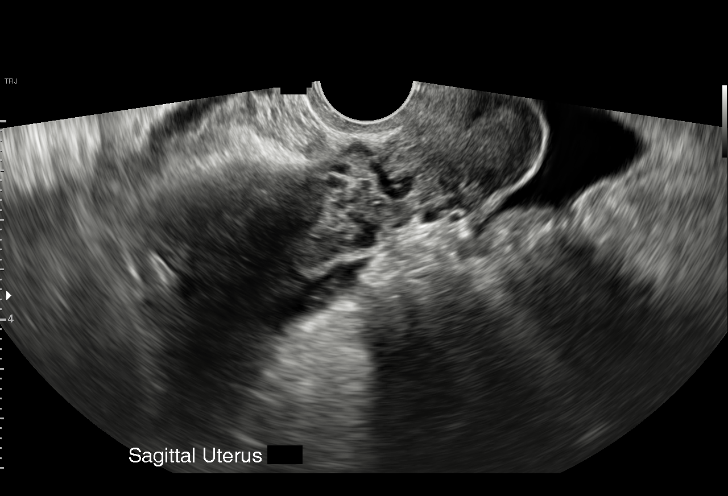
[im 53/68]
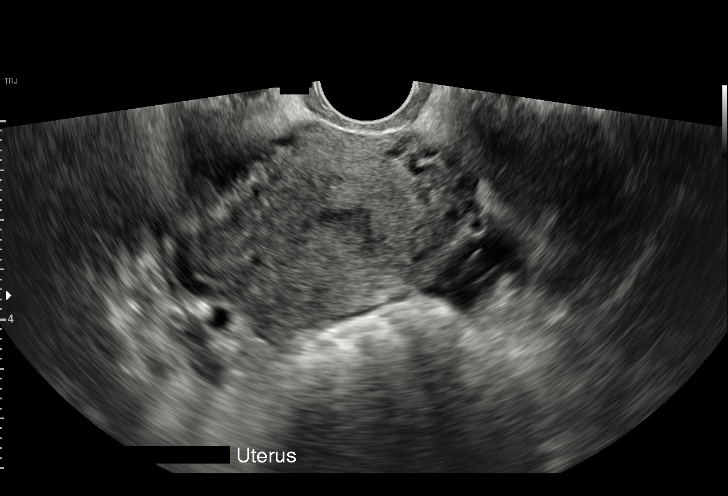
[im 58/68]
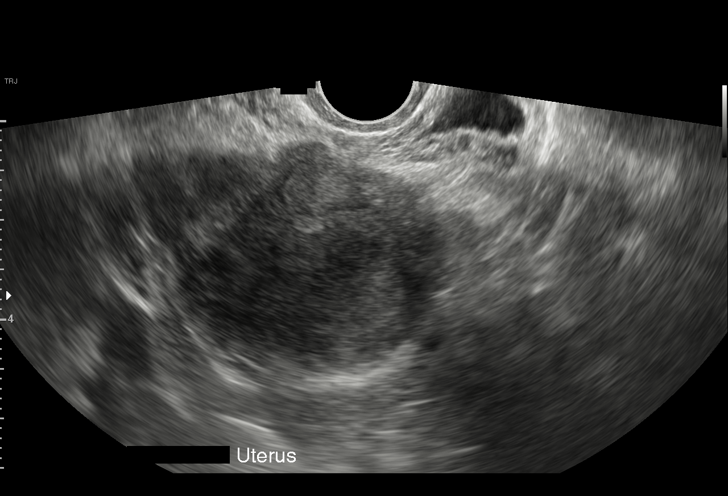
[im 63/68]
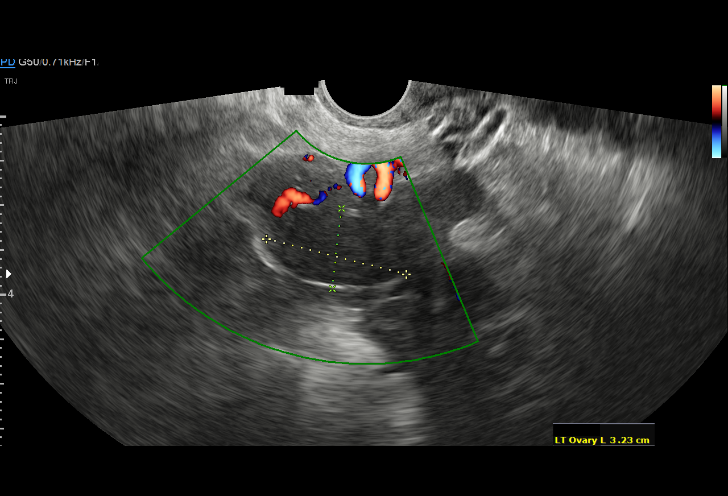
[im 68/68]
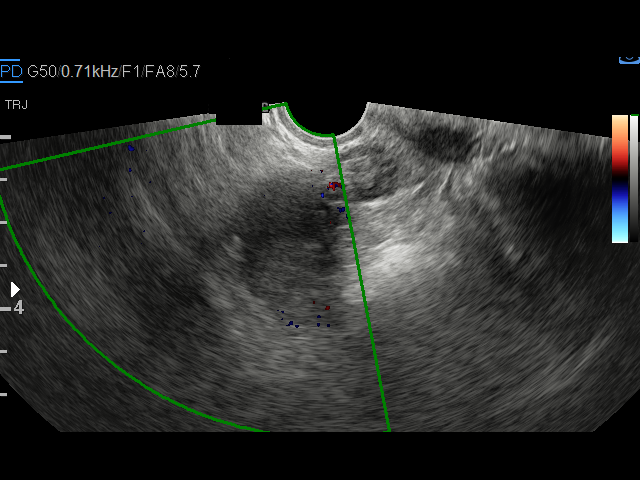

[15 of 28 positions shown; findings below may reference images not displayed]

FINDINGS: Intrauterine gestational sac: Not visualized

Yolk sac:  N/A

Embryo:  N/A

Cardiac Activity: N/A

Heart Rate: N/A  bpm

MSD:   mm    w     d

CRL:    mm    w    d                  US EDC:

Subchorionic hemorrhage:  N/A

Maternal uterus/adnexae:

Normal uterine morphology.

Endometrial complex with trophoblastic reaction measuring up to 21
mm thick.

No gestational sac, focal fluid collection or endometrial fluid
definitely visualized.

Small amount of simple appearing free pelvic fluid.

RIGHT ovary measures 2.5 x 3.5 x 2.0 cm and contains a small
dominant follicle 19 mm diameter.

LEFT ovary measures 3.2 x 1.8 x 2.3 cm and contains a small corpus
luteum.

No adnexal masses.
IMPRESSION: No intrauterine gestation identified.

Findings are consistent with pregnancy of unknown location.

Differential diagnosis includes early intrauterine pregnancy too
early to visualize, spontaneous abortion, and ectopic pregnancy.

Serial quantitative beta hCG and or followup ultrasound recommended
to definitively exclude ectopic pregnancy.
